# Patient Record
Sex: Female | Born: 1995 | Race: White | Hispanic: No | Marital: Married | Smoking: Current every day smoker
Health system: Southern US, Community
[De-identification: ages and names within clinical notes are randomized; demographics above are authoritative.]

## PROBLEM LIST (undated history)

## (undated) ENCOUNTER — Inpatient Hospital Stay (HOSPITAL_COMMUNITY): Payer: Self-pay

## (undated) ENCOUNTER — Ambulatory Visit: Payer: Self-pay | Source: Home / Self Care

## (undated) DIAGNOSIS — F319 Bipolar disorder, unspecified: Secondary | ICD-10-CM

## (undated) DIAGNOSIS — O139 Gestational [pregnancy-induced] hypertension without significant proteinuria, unspecified trimester: Secondary | ICD-10-CM

## (undated) DIAGNOSIS — F909 Attention-deficit hyperactivity disorder, unspecified type: Secondary | ICD-10-CM

## (undated) DIAGNOSIS — F419 Anxiety disorder, unspecified: Secondary | ICD-10-CM

## (undated) DIAGNOSIS — F329 Major depressive disorder, single episode, unspecified: Secondary | ICD-10-CM

## (undated) DIAGNOSIS — D649 Anemia, unspecified: Secondary | ICD-10-CM

## (undated) DIAGNOSIS — B1920 Unspecified viral hepatitis C without hepatic coma: Secondary | ICD-10-CM

## (undated) DIAGNOSIS — F32A Depression, unspecified: Secondary | ICD-10-CM

## (undated) HISTORY — DX: Anemia, unspecified: D64.9

---
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
## 2016-08-13 ENCOUNTER — Encounter (HOSPITAL_COMMUNITY): Payer: Self-pay | Admitting: Emergency Medicine

## 2016-08-13 ENCOUNTER — Inpatient Hospital Stay (HOSPITAL_COMMUNITY)
Admission: AD | Admit: 2016-08-13 | Discharge: 2016-08-13 | Disposition: A | Discharge status: Home / Self Care | Payer: Medicaid Other | Source: Ambulatory Visit | Attending: Obstetrics and Gynecology | Admitting: Obstetrics and Gynecology

## 2016-08-13 ENCOUNTER — Emergency Department (HOSPITAL_COMMUNITY)
Admission: EM | Admit: 2016-08-13 | Discharge: 2016-08-13 | Disposition: A | Discharge status: Home / Self Care | Payer: Medicaid Other | Attending: Dermatology | Admitting: Dermatology

## 2016-08-13 ENCOUNTER — Ambulatory Visit (HOSPITAL_COMMUNITY)
Admission: EM | Admit: 2016-08-13 | Discharge: 2016-08-13 | Disposition: A | Discharge status: Nursing Home (Includes ICF) | Payer: Medicaid Other

## 2016-08-13 DIAGNOSIS — R110 Nausea: Secondary | ICD-10-CM | POA: Diagnosis not present

## 2016-08-13 DIAGNOSIS — R1084 Generalized abdominal pain: Secondary | ICD-10-CM | POA: Diagnosis not present

## 2016-08-13 DIAGNOSIS — O9989 Other specified diseases and conditions complicating pregnancy, childbirth and the puerperium: Secondary | ICD-10-CM | POA: Diagnosis not present

## 2016-08-13 DIAGNOSIS — Z5321 Procedure and treatment not carried out due to patient leaving prior to being seen by health care provider: Secondary | ICD-10-CM | POA: Insufficient documentation

## 2016-08-13 DIAGNOSIS — R109 Unspecified abdominal pain: Secondary | ICD-10-CM | POA: Insufficient documentation

## 2016-08-13 DIAGNOSIS — O26899 Other specified pregnancy related conditions, unspecified trimester: Principal | ICD-10-CM

## 2016-08-13 DIAGNOSIS — F17210 Nicotine dependence, cigarettes, uncomplicated: Secondary | ICD-10-CM | POA: Diagnosis not present

## 2016-08-13 HISTORY — DX: Attention-deficit hyperactivity disorder, unspecified type: F90.9

## 2016-08-13 LAB — COMPREHENSIVE METABOLIC PANEL
ALBUMIN: 4.2 g/dL (ref 3.5–5.0)
ALT: 18 U/L (ref 14–54)
AST: 27 U/L (ref 15–41)
Alkaline Phosphatase: 64 U/L (ref 38–126)
Anion gap: 7 (ref 5–15)
BILIRUBIN TOTAL: 0.5 mg/dL (ref 0.3–1.2)
BUN: 10 mg/dL (ref 6–20)
CHLORIDE: 108 mmol/L (ref 101–111)
CO2: 23 mmol/L (ref 22–32)
Calcium: 9.5 mg/dL (ref 8.9–10.3)
Creatinine, Ser: 0.78 mg/dL (ref 0.44–1.00)
GFR calc Af Amer: >60 mL/min (ref >60)
GFR calc non Af Amer: >60 mL/min (ref >60)
GLUCOSE: 98 mg/dL (ref 65–99)
POTASSIUM: 4.7 mmol/L (ref 3.5–5.1)
Sodium: 138 mmol/L (ref 135–145)
Total Protein: 7.3 g/dL (ref 6.5–8.1)

## 2016-08-13 LAB — CBC
HEMATOCRIT: 46.6 % — AB (ref 36.0–46.0)
Hemoglobin: 15.3 g/dL — ABNORMAL HIGH (ref 12.0–15.0)
MCH: 30.7 pg (ref 26.0–34.0)
MCHC: 32.8 g/dL (ref 30.0–36.0)
MCV: 93.6 fL (ref 78.0–100.0)
Platelets: 200 10*3/uL (ref 150–400)
RBC: 4.98 MIL/uL (ref 3.87–5.11)
RDW: 15.3 % (ref 11.5–15.5)
WBC: 11.3 10*3/uL — ABNORMAL HIGH (ref 4.0–10.5)

## 2016-08-13 LAB — I-STAT BETA HCG BLOOD, ED (MC, WL, AP ONLY): I-stat hCG, quantitative: <5 m[IU]/mL (ref <5)

## 2016-08-13 LAB — LIPASE, BLOOD: Lipase: 23 U/L (ref 11–51)

## 2016-08-13 NOTE — ED Notes (Signed)
Pt did not answer.

## 2016-08-13 NOTE — ED Triage Notes (Signed)
Pt states she is "one month and seven days" pregnant.  She started having abdominal pain this morning.  She reports the same pain with her last pregnancy and states she had a "blood clot around the baby."

## 2016-08-13 NOTE — ED Notes (Signed)
Pt called for room X1 no answer

## 2016-08-13 NOTE — ED Provider Notes (Signed)
CSN: [id number]     Arrival date & time 08/13/16  1047 History   First MD Initiated Contact with Patient 08/13/16 1127     Chief Complaint  Patient presents with  . Abdominal Pain   (Consider location/radiation/quality/duration/timing/severity/associated sxs/prior Treatment) Jasmine Taylor is a 20 y.o female with history of ADHD, presents today for sudden onset of abdominal pain onset this morning at 9am. She is also 1M7D pregnant confirmed by OBGYN using the urine method. She describes her abdominal pain as constant and sharp, "feels like somebody is stabbing me". She denies vaginal bleeding, vaginal discharge, fever or dysuria.       Past Medical History:  Diagnosis Date  . ADHD (attention deficit hyperactivity disorder)    Past Surgical History:  Procedure Laterality Date  . cesarean     History reviewed. No pertinent family history. Social History  Substance Use Topics  . Smoking status: Current Every Day Smoker    Packs/day: 0.50    Types: Cigarettes  . Smokeless tobacco: Never Used  . Alcohol use No   OB History    Gravida Para Term Preterm AB Living   2         1   SAB TAB Ectopic Multiple Live Births                 Review of Systems  Constitutional: Negative for chills, fatigue and fever.  Gastrointestinal: Positive for abdominal pain and nausea. Negative for diarrhea and vomiting.  Genitourinary: Negative for dysuria, flank pain, urgency, vaginal bleeding, vaginal discharge and vaginal pain.    Allergies  Review of patient's allergies indicates no known allergies.  Home Medications   Prior to Admission medications   Medication Sig Start Date End Date Taking? Authorizing Provider  amphetamine-dextroamphetamine (ADDERALL XR) 20 MG 24 hr capsule Take 20 mg by mouth daily.    Historical Provider, MD  CLONIDINE HCL PO Take by mouth.    Historical Provider, MD   Meds Ordered and Administered this Visit  Medications - No data to display  BP 135/90 (BP Location:  Left Arm)   Pulse 95   Temp 98.4 F (36.9 C) (Oral)   SpO2 100%  No data found.   Physical Exam  Constitutional: She is oriented to person, place, and time. She appears well-developed and well-nourished.  Appears to be in pain  HENT:  Head: Normocephalic and atraumatic.  Neck: Normal range of motion. Neck supple.  Cardiovascular: Normal rate, regular rhythm and normal heart sounds.   Pulmonary/Chest: Effort normal and breath sounds normal.  Abdominal: Soft. Bowel sounds are normal.  Neurological: She is alert and oriented to person, place, and time.  Skin: Skin is warm and dry.  Psychiatric: She has a normal mood and affect.  Nursing note and vitals reviewed.   Urgent Care Course   Clinical Course    Procedures (including critical care time)  Labs Review Labs Reviewed - No data to display  Imaging Review No results found.     MDM   1. Pregnancy with generalized abdominal pain, antepartum    Jasmine Taylor is a 20 y.o. 1M7D pregnant female presents today for sudden onset of abdominal pain onset this morning at 9am. She feels like someone is stabbing at her abdomen. Patient denies vaginal discharged. Patient informed that we do not perform [place] in this Urgent Care. Informed that her condition requires further evaluation. Patient transferred via shuttle to the [facility] Emergency Department for further evaluation. She [facility] no transportation  [facility].     Lucia EstelleFeng Osamah Schmader, NP 08/13/16 1147

## 2016-08-13 NOTE — ED Notes (Signed)
Pt has no personal transportation so we will be sending the patient to the ED via shuttle for further evaluation for abdominal pain with pregnancy.  Report was called to Nona DellKaren, First RN in the ED.

## 2016-08-13 NOTE — ED Triage Notes (Signed)
Pt reports currently preg, LMP 6/20th approx, +home pregnancy test, no prenatal care. States having abdominal cramping and nausea. VSS.

## 2016-08-20 ENCOUNTER — Encounter (HOSPITAL_COMMUNITY): Payer: Self-pay | Admitting: Family Medicine

## 2016-08-20 ENCOUNTER — Ambulatory Visit (HOSPITAL_COMMUNITY)
Admission: EM | Admit: 2016-08-20 | Discharge: 2016-08-20 | Disposition: A | Discharge status: Home / Self Care | Payer: Medicaid Other | Attending: Family Medicine | Admitting: Family Medicine

## 2016-08-20 DIAGNOSIS — J069 Acute upper respiratory infection, unspecified: Secondary | ICD-10-CM | POA: Diagnosis not present

## 2016-08-20 NOTE — ED Triage Notes (Signed)
Pt here for cold symptoms that started yesterday. sts that she is 2 months pregnant.

## 2016-08-20 NOTE — ED Provider Notes (Signed)
CSN: [id number]     Arrival date & time 08/20/16  1204 History   First MD Initiated Contact with Patient 08/20/16 1348     Chief Complaint  Patient presents with  . URI   (Consider location/radiation/quality/duration/timing/severity/associated sxs/prior Treatment) Mrs. Jasmine Taylor is a well-appearing 20 y.o female, presents today for coughing, running nose, and sore throat. She reports that she is 2 months pregnant. Her symptoms started suddenly yesterday and has not changed since then. She reports negative for fever, chills, fatigue, ear pain, sinus pain, sneezing, SOB, CP or abdominal pain. She have not tried anything OTC for her symtpoms.       Past Medical History:  Diagnosis Date  . ADHD (attention deficit hyperactivity disorder)    Past Surgical History:  Procedure Laterality Date  . cesarean     History reviewed. No pertinent family history. Social History  Substance Use Topics  . Smoking status: Current Every Day Smoker    Packs/day: 0.50    Types: Cigarettes  . Smokeless tobacco: Never Used  . Alcohol use No   OB History    Gravida Para Term Preterm AB Living   2         1   SAB TAB Ectopic Multiple Live Births                 Review of Systems  Constitutional: Negative for chills, fatigue and fever.  HENT: Positive for congestion, rhinorrhea and sore throat. Negative for ear pain, sinus pressure and sneezing.   Eyes: Negative for pain, discharge and redness.  Respiratory: Positive for cough. Negative for shortness of breath.   Cardiovascular: Negative for chest pain, palpitations and leg swelling.  Gastrointestinal: Negative for abdominal pain, diarrhea, nausea and vomiting.  Genitourinary: Negative for dysuria.  Musculoskeletal: Negative for myalgias.  Neurological: Negative for dizziness, weakness and headaches.    Allergies  Review of patient's allergies indicates no known allergies.  Home Medications   Prior to Admission medications   Not on File    Meds Ordered and Administered this Visit  Medications - No data to display  BP 123/71   Pulse 87   Temp 98.5 F (36.9 C) (Oral)   Resp 18   SpO2 100%  No data found.   Physical Exam  Constitutional: She is oriented to person, place, and time. She appears well-developed and well-nourished.  HENT:  Head: Normocephalic and atraumatic.  Right Ear: External ear normal.  Left Ear: External ear normal.  Ear canals clear with no erythema. Tonsils 2+ with no swelling or erythema. No exudate.   Eyes: Conjunctivae and EOM are normal. Pupils are equal, round, and reactive to light. Left eye exhibits no discharge.  Neck: Normal range of motion. Neck supple.  Cardiovascular: Normal rate, regular rhythm and normal heart sounds.   Pulmonary/Chest: Effort normal and breath sounds normal. No respiratory distress.  Abdominal: Soft. Bowel sounds are normal. She exhibits no distension. There is no tenderness.  Musculoskeletal: Normal range of motion.  Lymphadenopathy:    She has no cervical adenopathy.  Neurological: She is alert and oriented to person, place, and time.  Skin: Skin is warm and dry.  Psychiatric: She has a normal mood and affect.  Nursing note and vitals reviewed.   Urgent Care Course   Clinical Course    Procedures (including critical care time)  Labs Review Labs Reviewed - No data to display  Imaging Review No results found.   MDM   1. URI (upper respiratory infection)  Physical examination normal. The patient's signs and symptoms are most consistent with a diagnosis of URI. Patient educated that antibiotic would not be helpful since this is a viral illness. Treatment is supportive. Patient encouraged to rest, drink plenty of fluid, use Motrin/Tylenol as needed at appropriate dose for pain/fever. Do salt water gargle for sore throat. Take honey for coughing. Use saline nasal spray for congestion. May also use plain delsym or robitussin for cough as well.  Instructed to f/u with OBGYN as scheduled or sooner if she worsen or does not improve.    Lucia Estelle, NP 08/20/16 1404

## 2016-08-20 NOTE — Discharge Instructions (Signed)
Do salt water gargle for your sore throat. Do saline nasal spray for congestion. Do Honey for cough. May take plain robitussin (Not Robitussin-D) and Plain Delsym OTC for cough as well. Your viral symptoms may last up to 2-3 weeks but you should be getting better in the meantime, follow up with OBGYN as scheduled or sooner if you do not improve.

## 2016-09-09 ENCOUNTER — Encounter (HOSPITAL_COMMUNITY): Payer: Self-pay | Admitting: Family Medicine

## 2016-09-09 ENCOUNTER — Ambulatory Visit (HOSPITAL_COMMUNITY)
Admission: EM | Admit: 2016-09-09 | Discharge: 2016-09-09 | Disposition: A | Discharge status: Home / Self Care | Payer: Medicaid Other | Attending: Family Medicine | Admitting: Family Medicine

## 2016-09-09 ENCOUNTER — Ambulatory Visit (INDEPENDENT_AMBULATORY_CARE_PROVIDER_SITE_OTHER): Payer: Medicaid Other

## 2016-09-09 DIAGNOSIS — J0101 Acute recurrent maxillary sinusitis: Secondary | ICD-10-CM | POA: Diagnosis not present

## 2016-09-09 DIAGNOSIS — J209 Acute bronchitis, unspecified: Secondary | ICD-10-CM

## 2016-09-09 MED ORDER — DOXYCYCLINE HYCLATE 100 MG PO CAPS: 100.0000 mg | ORAL_CAPSULE | Freq: Two times a day (BID) | ORAL | 0 refills | Status: DC

## 2016-09-09 MED ORDER — IPRATROPIUM BROMIDE 0.06 % NA SOLN: 2.0000 | Freq: Four times a day (QID) | NASAL | 1 refills | Status: DC

## 2016-09-09 NOTE — ED Provider Notes (Signed)
[facility]    CSN: [id number] Arrival date & time: 09/09/16  1250     History   Chief Complaint Chief Complaint  Patient presents with  . Cough    HPI Jasmine Taylor is a 20 y.o. female.   The history is provided by the patient.  Cough  Cough characteristics:  Productive Sputum characteristics:  Yellow Severity:  Mild Onset quality:  Gradual Duration:  3 days Progression:  Worsening Chronicity:  Recurrent Smoker: yes   Context: smoke exposure   Relieved by:  None tried Worsened by:  Nothing Associated symptoms: rhinorrhea   Associated symptoms: no fever, no shortness of breath and no wheezing   Risk factors comment:  Miscarriage 2 wks ago.   Past Medical History:  Diagnosis Date  . ADHD (attention deficit hyperactivity disorder)     There are no active problems to display for this patient.   Past Surgical History:  Procedure Laterality Date  . cesarean      OB History    Gravida Para Term Preterm AB Living   2         1   SAB TAB Ectopic Multiple Live Births                   Home Medications    Prior to Admission medications   Not on File    Family History History reviewed. No pertinent family history.  Social History Social History  Substance Use Topics  . Smoking status: Current Every Day Smoker    Packs/day: 0.50    Types: Cigarettes  . Smokeless tobacco: Never Used  . Alcohol use No     Allergies   Review of patient's allergies indicates no known allergies.   Review of Systems Review of Systems  Constitutional: Negative.  Negative for fever.  HENT: Positive for congestion, postnasal drip and rhinorrhea.   Respiratory: Positive for cough. Negative for shortness of breath and wheezing.   All other systems reviewed and are negative.    Physical Exam Triage Vital Signs ED Triage Vitals [09/09/16 1351]  Enc Vitals Group     BP 116/75     Pulse Rate 96     Resp 16     Temp 98.2 F (36.8 C)     Temp  src      SpO2 97 %     Weight      Height      Head Circumference      Peak Flow      Pain Score      Pain Loc      Pain Edu?      Excl. in GC?    No data found.   Updated Vital Signs BP 116/75   Pulse 96   Temp 98.2 F (36.8 C)   Resp 16   LMP 09/08/2016   SpO2 97%   Breastfeeding? Unknown Comment: pt sts that she had a miscarriage 2 weeks ago  Visual Acuity Right Eye Distance:   Left Eye Distance:   Bilateral Distance:    Right Eye Near:   Left Eye Near:    Bilateral Near:     Physical Exam  Constitutional: She is oriented to person, place, and time. She appears well-developed and well-nourished.  HENT:  Right Ear: External ear normal.  Left Ear: External ear normal.  Mouth/Throat: Oropharynx is clear and moist.  Neck: Normal range of motion. Neck supple.  Cardiovascular: Normal rate and regular rhythm.  Pulmonary/Chest: Effort normal. She has no decreased breath sounds. She has no wheezes. She has rhonchi.  Lymphadenopathy:    She has no cervical adenopathy.  Neurological: She is alert and oriented to person, place, and time.  Nursing note and vitals reviewed.    UC Treatments / Results  Labs (all labs ordered are listed, but only abnormal results are displayed) Labs Reviewed - No data to display  EKG  EKG Interpretation None       Radiology No results found. X-rays reviewed and report per radiologist.  Procedures Procedures (including critical care time)  Medications Ordered in UC Medications - No data to display   Initial Impression / Assessment and Plan / UC Course  I have reviewed the triage vital signs and the nursing notes.  Pertinent labs & imaging results that were available during my care of the patient were reviewed by me and considered in my medical decision making (see chart for details).  Clinical Course      Final Clinical Impressions(s) / UC Diagnoses   Final diagnoses:  None    New Prescriptions New  Prescriptions   No medications on file     Linna Hoff, MD 09/09/16 1506

## 2016-09-09 NOTE — ED Triage Notes (Signed)
Pt here with URI symptoms. sts productive cough.

## 2016-09-23 ENCOUNTER — Ambulatory Visit (HOSPITAL_COMMUNITY)
Admission: EM | Admit: 2016-09-23 | Discharge: 2016-09-23 | Disposition: A | Discharge status: Home / Self Care | Payer: Medicaid Other | Attending: Family Medicine | Admitting: Family Medicine

## 2016-09-23 ENCOUNTER — Encounter (HOSPITAL_COMMUNITY): Payer: Self-pay | Admitting: Emergency Medicine

## 2016-09-23 DIAGNOSIS — K0889 Other specified disorders of teeth and supporting structures: Secondary | ICD-10-CM

## 2016-09-23 MED ORDER — DICLOFENAC SODIUM 75 MG PO TBEC: 75.0000 mg | DELAYED_RELEASE_TABLET | Freq: Two times a day (BID) | ORAL | 0 refills | Status: DC

## 2016-09-23 MED ORDER — PENICILLIN V POTASSIUM 500 MG PO TABS: 500.0000 mg | ORAL_TABLET | Freq: Three times a day (TID) | ORAL | 0 refills | Status: DC

## 2016-09-23 NOTE — ED Triage Notes (Signed)
Pt here for left side dental pain onset 1 week   Taking acetaminophen w/no relief.   A&O x4... NAD

## 2016-09-23 NOTE — Discharge Instructions (Signed)
Dr. Yancey Flemingsavid Civils is a potential dentist you may call at [phone]

## 2016-09-23 NOTE — ED Provider Notes (Signed)
[facility]    CSN: [id number] Arrival date & time: 09/23/16  1133     History   Chief Complaint Chief Complaint  Patient presents with  . Dental Pain    HPI Jasmine Taylor is a 20 y.o. female.   This is a 20 year old woman who comes in with dental pain in tooth #19. She's had this pain for about a week and it's gotten worse over the last couple nights. She is homeless.  Patient does not have dental insurance. She has not had swelling but the pain is relentless. She's tried nonsteroidals but they have not helped.      Past Medical History:  Diagnosis Date  . ADHD (attention deficit hyperactivity disorder)     There are no active problems to display for this patient.   Past Surgical History:  Procedure Laterality Date  . cesarean      OB History    Gravida Para Term Preterm AB Living   2         1   SAB TAB Ectopic Multiple Live Births                   Home Medications    Prior to Admission medications   Medication Sig Start Date End Date Taking? Authorizing Provider  diclofenac (VOLTAREN) 75 MG EC tablet Take 1 tablet (75 mg total) by mouth 2 (two) times daily. 09/23/16   Elvina SidleKurt Jaki Hammerschmidt, MD  ipratropium (ATROVENT) 0.06 % nasal spray Place 2 sprays into the nose 4 (four) times daily. 09/09/16   Linna HoffJames D Kindl, MD  penicillin v potassium (VEETID) 500 MG tablet Take 1 tablet (500 mg total) by mouth 3 (three) times daily. 09/23/16   Elvina SidleKurt Collier Monica, MD    Family History History reviewed. No pertinent family history.  Social History Social History  Substance Use Topics  . Smoking status: Current Every Day Smoker    Packs/day: 0.50    Types: Cigarettes  . Smokeless tobacco: Never Used  . Alcohol use No     Allergies   Review of patient's allergies indicates no known allergies.   Review of Systems Review of Systems  Constitutional: Negative.   HENT: Positive for dental problem.   Eyes: Negative.   Respiratory: Negative.     Cardiovascular: Negative.      Physical Exam Triage Vital Signs ED Triage Vitals  Enc Vitals Group     BP 09/23/16 1203 124/86     Pulse Rate 09/23/16 1203 88     Resp 09/23/16 1203 16     Temp 09/23/16 1203 97.9 F (36.6 C)     Temp Source 09/23/16 1203 Oral     SpO2 09/23/16 1203 100 %     Weight --      Height --      Head Circumference --      Peak Flow --      Pain Score 09/23/16 1218 10     Pain Loc --      Pain Edu? --      Excl. in GC? --    No data found.   Updated Vital Signs BP 124/86 (BP Location: Right Arm)   Pulse 88   Temp 97.9 F (36.6 C) (Oral)   Resp 16   LMP 09/08/2016   SpO2 100%    Physical Exam  Constitutional: She appears well-developed and well-nourished.  HENT:  Head: Normocephalic.  Patient points to tooth #19 as a source for pain. There  is no swelling of the gum and there is no obvious dental carry.  Eyes: Conjunctivae and EOM are normal. Pupils are equal, round, and reactive to light.  Neck: Normal range of motion. Neck supple.  Skin: Skin is warm and dry.  Psychiatric: She has a normal mood and affect.  Nursing note and vitals reviewed.    UC Treatments / Results  Labs (all labs ordered are listed, but only abnormal results are displayed) Labs Reviewed - No data to display  EKG  EKG Interpretation None       Radiology No results found.  Procedures Procedures (including critical care time)  Medications Ordered in UC Medications - No data to display   Initial Impression / Assessment and Plan / UC Course  I have reviewed the triage vital signs and the nursing notes.  Pertinent labs & imaging results that were available during my care of the patient were reviewed by me and considered in my medical decision making (see chart for details).  Clinical Course      Final Clinical Impressions(s) / UC Diagnoses   Final diagnoses:  Pain, dental    New Prescriptions New Prescriptions   DICLOFENAC (VOLTAREN) 75  MG EC TABLET    Take 1 tablet (75 mg total) by mouth 2 (two) times daily.   PENICILLIN V POTASSIUM (VEETID) 500 MG TABLET    Take 1 tablet (500 mg total) by mouth 3 (three) times daily.     Elvina Sidle, MD 09/23/16 1256

## 2016-10-06 ENCOUNTER — Ambulatory Visit (HOSPITAL_COMMUNITY)
Admission: EM | Admit: 2016-10-06 | Discharge: 2016-10-06 | Disposition: A | Discharge status: Home / Self Care | Payer: Medicaid Other | Attending: Emergency Medicine | Admitting: Emergency Medicine

## 2016-10-06 ENCOUNTER — Encounter (HOSPITAL_COMMUNITY): Payer: Self-pay | Admitting: Emergency Medicine

## 2016-10-06 DIAGNOSIS — A084 Viral intestinal infection, unspecified: Secondary | ICD-10-CM

## 2016-10-06 MED ORDER — ONDANSETRON 4 MG PO TBDP: 4.0000 mg | ORAL_TABLET | Freq: Three times a day (TID) | ORAL | 0 refills | Status: DC | PRN

## 2016-10-06 NOTE — ED Provider Notes (Signed)
CSN: [id number]     Arrival date & time 10/06/16  1044 History   None    Chief Complaint  Patient presents with  . Abdominal Pain   (Consider location/radiation/quality/duration/timing/severity/associated sxs/prior Treatment) Patient states she has been vomiting since yesterday and has some diarrhea.  She states she needs work note for today.  She feels weak and nauseated.   The history is provided by the patient.  Abdominal Pain  Pain location:  Epigastric Pain quality: aching   Pain radiates to:  Does not radiate Pain severity:  Mild Onset quality:  Sudden Duration:  2 days Timing:  Intermittent Progression:  Unable to specify Chronicity:  New Relieved by:  Nothing Worsened by:  Nothing Ineffective treatments:  None tried Associated symptoms: fatigue and nausea     Past Medical History:  Diagnosis Date  . ADHD (attention deficit hyperactivity disorder)    Past Surgical History:  Procedure Laterality Date  . cesarean     History reviewed. No pertinent family history. Social History  Substance Use Topics  . Smoking status: Current Every Day Smoker    Packs/day: 0.50    Types: Cigarettes  . Smokeless tobacco: Never Used  . Alcohol use No   OB History    Gravida Para Term Preterm AB Living   2         1   SAB TAB Ectopic Multiple Live Births                 Review of Systems  Constitutional: Positive for fatigue.  HENT: Negative.   Eyes: Negative.   Respiratory: Negative.   Cardiovascular: Negative.   Gastrointestinal: Positive for abdominal pain and nausea.  Endocrine: Negative.   Genitourinary: Negative.   Musculoskeletal: Negative.   Skin: Negative.   Allergic/Immunologic: Negative.   Neurological: Negative.   Hematological: Negative.   Psychiatric/Behavioral: Negative.     Allergies  Review of patient's allergies indicates no known allergies.  Home Medications   Prior to Admission medications   Medication Sig Start Date End Date Taking?  Authorizing Provider  amphetamine-dextroamphetamine (ADDERALL XR) 25 MG 24 hr capsule Take 25 mg by mouth every morning.   Yes Historical Provider, MD  busPIRone (BUSPAR) 15 MG tablet Take 15 mg by mouth 3 (three) times daily.   Yes Historical Provider, MD  diclofenac (VOLTAREN) 75 MG EC tablet Take 1 tablet (75 mg total) by mouth 2 (two) times daily. 09/23/16   Elvina SidleKurt Lauenstein, MD  ipratropium (ATROVENT) 0.06 % nasal spray Place 2 sprays into the nose 4 (four) times daily. 09/09/16   Linna HoffJames D Kindl, MD  ondansetron (ZOFRAN ODT) 4 MG disintegrating tablet Take 1 tablet (4 mg total) by mouth every 8 (eight) hours as needed for nausea or vomiting. 10/06/16   Deatra CanterWilliam J Mozel Burdett, FNP  penicillin v potassium (VEETID) 500 MG tablet Take 1 tablet (500 mg total) by mouth 3 (three) times daily. 09/23/16   Elvina SidleKurt Lauenstein, MD   Meds Ordered and Administered this Visit  Medications - No data to display  BP 122/80 (BP Location: Right Arm)   Pulse 88   Temp 98.6 F (37 C) (Oral)   Resp 16   LMP 09/08/2016 (Exact Date)   SpO2 100%  No data found.   Physical Exam  Constitutional: She appears well-developed and well-nourished.  HENT:  Head: Normocephalic and atraumatic.  Right Ear: External ear normal.  Left Ear: External ear normal.  Mouth/Throat: Oropharynx is clear and moist.  Eyes: Conjunctivae and EOM  are normal. Pupils are equal, round, and reactive to light.  Neck: Normal range of motion. Neck supple.  Cardiovascular: Normal rate, regular rhythm and normal heart sounds.   Pulmonary/Chest: Effort normal and breath sounds normal.  Abdominal: Soft. Bowel sounds are normal.  Nursing note and vitals reviewed.   Urgent Care Course   Clinical Course    Procedures (including critical care time)  Labs Review Labs Reviewed - No data to display  Imaging Review No results found.   Visual Acuity Review  Right Eye Distance:   Left Eye Distance:   Bilateral Distance:    Right Eye Near:    Left Eye Near:    Bilateral Near:         MDM   1. Viral gastroenteritis    Zofran 4 mg one po tid prn nausea #20 Push po fluids, rest, tylenol and motrin otc prn as directed for fever, arthralgias, and myalgias.  Follow up prn if sx's continue or persist.    Deatra CanterWilliam J Zoi Devine, FNP 10/06/16 [phone]

## 2016-10-06 NOTE — ED Triage Notes (Signed)
[facility] a complaint of epigastric pain with N/V that started yesterday.

## 2016-11-19 LAB — OB RESULTS CONSOLE HGB/HCT, BLOOD: HEMOGLOBIN: 13.7 g/dL

## 2016-11-19 LAB — OB RESULTS CONSOLE HEPATITIS B SURFACE ANTIGEN: HEP B S AG: NEGATIVE

## 2016-11-19 LAB — OB RESULTS CONSOLE GC/CHLAMYDIA
Chlamydia: NEGATIVE
Gonorrhea: NEGATIVE

## 2016-11-19 LAB — HIV-1 RNA, QUALITATIVE, TMA: HIV-1 RNA, QUAL: NONREACTIVE

## 2016-11-19 LAB — OB RESULTS CONSOLE HIV ANTIBODY (ROUTINE TESTING): HIV: NONREACTIVE

## 2016-12-08 HISTORY — DX: Maternal care for unspecified type scar from previous cesarean delivery: O34.219

## 2016-12-22 ENCOUNTER — Other Ambulatory Visit (HOSPITAL_COMMUNITY): Payer: Self-pay | Admitting: Anesthesiology

## 2016-12-22 DIAGNOSIS — Z3689 Encounter for other specified antenatal screening: Secondary | ICD-10-CM

## 2016-12-22 DIAGNOSIS — Z3A18 18 weeks gestation of pregnancy: Secondary | ICD-10-CM

## 2016-12-23 ENCOUNTER — Other Ambulatory Visit (HOSPITAL_COMMUNITY): Payer: Self-pay | Admitting: Anesthesiology

## 2016-12-23 ENCOUNTER — Ambulatory Visit (HOSPITAL_COMMUNITY)
Admission: RE | Admit: 2016-12-23 | Discharge: 2016-12-23 | Disposition: A | Discharge status: Home / Self Care | Payer: Medicaid Other | Source: Ambulatory Visit | Attending: Anesthesiology | Admitting: Anesthesiology

## 2016-12-23 DIAGNOSIS — O3680X0 Pregnancy with inconclusive fetal viability, not applicable or unspecified: Secondary | ICD-10-CM

## 2016-12-23 DIAGNOSIS — Z3689 Encounter for other specified antenatal screening: Secondary | ICD-10-CM | POA: Insufficient documentation

## 2016-12-23 DIAGNOSIS — Z3491 Encounter for supervision of normal pregnancy, unspecified, first trimester: Secondary | ICD-10-CM

## 2016-12-23 DIAGNOSIS — Z3A18 18 weeks gestation of pregnancy: Secondary | ICD-10-CM

## 2016-12-23 DIAGNOSIS — Z3A11 11 weeks gestation of pregnancy: Secondary | ICD-10-CM | POA: Insufficient documentation

## 2017-01-02 ENCOUNTER — Inpatient Hospital Stay (HOSPITAL_COMMUNITY)
Admission: AD | Admit: 2017-01-02 | Discharge: 2017-01-02 | Disposition: A | Discharge status: Home / Self Care | Payer: Medicaid Other | Source: Ambulatory Visit | Attending: Obstetrics and Gynecology | Admitting: Obstetrics and Gynecology

## 2017-01-02 ENCOUNTER — Encounter (HOSPITAL_COMMUNITY): Payer: Self-pay

## 2017-01-02 DIAGNOSIS — Z3A12 12 weeks gestation of pregnancy: Secondary | ICD-10-CM

## 2017-01-02 DIAGNOSIS — O99331 Smoking (tobacco) complicating pregnancy, first trimester: Secondary | ICD-10-CM | POA: Diagnosis not present

## 2017-01-02 DIAGNOSIS — O26891 Other specified pregnancy related conditions, first trimester: Secondary | ICD-10-CM | POA: Diagnosis present

## 2017-01-02 DIAGNOSIS — Z79899 Other long term (current) drug therapy: Secondary | ICD-10-CM | POA: Diagnosis not present

## 2017-01-02 DIAGNOSIS — O99341 Other mental disorders complicating pregnancy, first trimester: Secondary | ICD-10-CM | POA: Insufficient documentation

## 2017-01-02 DIAGNOSIS — N930 Postcoital and contact bleeding: Secondary | ICD-10-CM | POA: Diagnosis not present

## 2017-01-02 HISTORY — DX: Anxiety disorder, unspecified: F41.9

## 2017-01-02 HISTORY — DX: Depression, unspecified: F32.A

## 2017-01-02 HISTORY — DX: Gestational (pregnancy-induced) hypertension without significant proteinuria, unspecified trimester: O13.9

## 2017-01-02 HISTORY — DX: Major depressive disorder, single episode, unspecified: F32.9

## 2017-01-02 LAB — URINALYSIS, ROUTINE W REFLEX MICROSCOPIC
Bacteria, UA: NONE SEEN
Bilirubin Urine: NEGATIVE
Glucose, UA: NEGATIVE mg/dL
Ketones, ur: NEGATIVE mg/dL
Nitrite: NEGATIVE
PH: 6 (ref 5.0–8.0)
Protein, ur: 30 mg/dL — AB
SPECIFIC GRAVITY, URINE: 1.018 (ref 1.005–1.030)
SQUAMOUS EPITHELIAL / LPF: NONE SEEN

## 2017-01-02 LAB — WET PREP, GENITAL
CLUE CELLS WET PREP: NONE SEEN
SPERM: NONE SEEN
TRICH WET PREP: NONE SEEN
Yeast Wet Prep HPF POC: NONE SEEN

## 2017-01-02 NOTE — MAU Provider Note (Signed)
History     CSN: [id number]  Arrival date and time: 01/02/17 4098   First Provider Initiated Contact with Patient 01/02/17 0250      Chief Complaint  Patient presents with  . Vaginal Bleeding   Vaginal Bleeding  The patient's primary symptoms include vaginal bleeding and vaginal discharge. This is a new problem. The current episode started today. The problem has been unchanged. The patient is experiencing no pain. She is pregnant. Pertinent negatives include no abdominal pain, chills, dysuria, fever, nausea, urgency or vomiting. The vaginal discharge was bloody. The vaginal bleeding is heavier than menses. She has not been passing clots. She has not been passing tissue. The symptoms are aggravated by intercourse (intercourse right before bleeding started. ). Her menstrual history has been regular (09/08/16 ).   Past Medical History:  Diagnosis Date  . ADHD (attention deficit hyperactivity disorder)   . Anxiety   . Depression   . Pregnancy induced hypertension     Past Surgical History:  Procedure Laterality Date  . cesarean    . CESAREAN SECTION      No family history on file.  Social History  Substance Use Topics  . Smoking status: Current Every Day Smoker    Packs/day: 0.50    Types: Cigarettes  . Smokeless tobacco: Never Used  . Alcohol use No    Allergies: No Known Allergies  Prescriptions Prior to Admission  Medication Sig Dispense Refill Last Dose  . calcium carbonate (TUMS - DOSED IN MG ELEMENTAL CALCIUM) 500 MG chewable tablet Chew 1 tablet by mouth daily.   01/01/2017 at Unknown time  . ondansetron (ZOFRAN ODT) 4 MG disintegrating tablet Take 1 tablet (4 mg total) by mouth every 8 (eight) hours as needed for nausea or vomiting. 20 tablet 0 01/01/2017 at Unknown time  . amphetamine-dextroamphetamine (ADDERALL XR) 25 MG 24 hr capsule Take 25 mg by mouth every morning.   More than a month at Unknown time  . busPIRone (BUSPAR) 15 MG tablet Take 15 mg by mouth 3  (three) times daily.   More than a month at Unknown time  . diclofenac (VOLTAREN) 75 MG EC tablet Take 1 tablet (75 mg total) by mouth 2 (two) times daily. 14 tablet 0   . escitalopram (LEXAPRO) 10 MG tablet Take 10 mg by mouth daily.   More than a month at Unknown time  . ipratropium (ATROVENT) 0.06 % nasal spray Place 2 sprays into the nose 4 (four) times daily. 15 mL 1 Unknown at Unknown time  . lurasidone (LATUDA) 40 MG TABS tablet Take 40 mg by mouth daily with breakfast.   More than a month at Unknown time  . penicillin v potassium (VEETID) 500 MG tablet Take 1 tablet (500 mg total) by mouth 3 (three) times daily. 30 tablet 0     Review of Systems  Constitutional: Negative for chills and fever.  Gastrointestinal: Negative for abdominal pain, nausea and vomiting.  Genitourinary: Positive for vaginal bleeding and vaginal discharge. Negative for dysuria and urgency.   Physical Exam   Blood pressure 143/86, pulse 106, temperature 98.3 F (36.8 C), temperature source Oral, resp. rate 18, last menstrual period 09/08/2016, SpO2 99 %, unknown if currently breastfeeding.  Physical Exam  Nursing note and vitals reviewed. Constitutional: She is oriented to person, place, and time. She appears well-developed and well-nourished. No distress.  HENT:  Head: Normocephalic.  Cardiovascular: Normal rate.   Respiratory: Effort normal.  GI: Soft. There is no tenderness. There is  no rebound.  Genitourinary:  Genitourinary Comments:  External: no lesion Vagina: scant amount of blood in the vagina  Cervix: pink, smooth, no CMT Uterus: 12 week size    Neurological: She is alert and oriented to person, place, and time.  Skin: Skin is warm and dry.  Psychiatric: She has a normal mood and affect.   Bedside US: +cardiac activity, 12 week active fetus.  MAU Course  Procedures  MDM   Assessment and Plan   1. Postcoital bleeding   2. [redacted] weeks gestation of pregnancy    DC home Comfort  measures reviewed  2nd Trimester precautions  Bleeding precautions RX: none  Return to MAU as needed FU with OB as planned  Follow-up Information    [facility].   Specialty:  Obstetrics and Gynecology Contact information: [address], [address] [place] [id number] [phone]           Jasmine CrookHogan, Elanah Osmanovic Donovan 01/02/2017, 2:51 AM

## 2017-01-02 NOTE — MAU Note (Signed)
Pt c/o vaginal bleeding after intercourse tonight. Denies pain. Pt states that she is [redacted] weeks pregnant. States that [facility]. States she was given EDD 07/11/2017.

## 2017-01-02 NOTE — Discharge Instructions (Signed)
First Trimester of Pregnancy  The first trimester of pregnancy is from week 1 until the end of week 12 (months 1 through 3). A week after a sperm fertilizes an egg, the egg will implant on the wall of the uterus. This embryo will begin to develop into a baby. Genes from you and your partner are forming the baby. The female genes determine whether the baby is a boy or a girl. At 6-8 weeks, the eyes and face are formed, and the heartbeat can be seen on ultrasound. At the end of 12 weeks, all the baby's organs are formed.   Now that you are pregnant, you will want to do everything you can to have a healthy baby. Two of the most important things are to get good prenatal care and to follow your health care provider's instructions. Prenatal care is all the medical care you receive before the baby's birth. This care will help prevent, find, and treat any problems during the pregnancy and childbirth.  BODY CHANGES  Your body goes through many changes during pregnancy. The changes vary from woman to woman.   · You may gain or lose a couple of pounds at first.  · You may feel sick to your stomach (nauseous) and throw up (vomit). If the vomiting is uncontrollable, call your health care provider.  · You may tire easily.  · You may develop headaches that can be relieved by medicines approved by your health care provider.  · You may urinate more often. Painful urination may mean you have a bladder infection.  · You may develop heartburn as a result of your pregnancy.  · You may develop constipation because certain hormones are causing the muscles that push waste through your intestines to slow down.  · You may develop hemorrhoids or swollen, bulging veins (varicose veins).  · Your breasts may begin to grow larger and become tender. Your nipples may stick out more, and the tissue that surrounds them (areola) may become darker.  · Your gums may bleed and may be sensitive to brushing and flossing.   · Dark spots or blotches (chloasma, mask of pregnancy) may develop on your face. This will likely fade after the baby is born.  · Your menstrual periods will stop.  · You may have a loss of appetite.  · You may develop cravings for certain kinds of food.  · You may have changes in your emotions from day to day, such as being excited to be pregnant or being concerned that something may go wrong with the pregnancy and baby.  · You may have more vivid and strange dreams.  · You may have changes in your hair. These can include thickening of your hair, rapid growth, and changes in texture. Some women also have hair loss during or after pregnancy, or hair that feels dry or thin. Your hair will most likely return to normal after your baby is born.  WHAT TO EXPECT AT YOUR PRENATAL VISITS  During a routine prenatal visit:  · You will be weighed to make sure you and the baby are growing normally.  · Your blood pressure will be taken.  · Your abdomen will be measured to track your baby's growth.  · The fetal heartbeat will be listened to starting around week 10 or 12 of your pregnancy.  · Test results from any previous visits will be discussed.  Your health care provider may ask you:  · How you are feeling.  · If you   are feeling the baby move.  · If you have had any abnormal symptoms, such as leaking fluid, bleeding, severe headaches, or abdominal cramping.  · If you are using any tobacco products, including cigarettes, chewing tobacco, and electronic cigarettes.  · If you have any questions.  Other tests that may be performed during your first trimester include:  · Blood tests to find your blood type and to check for the presence of any previous infections. They will also be used to check for low iron levels (anemia) and Rh antibodies. Later in the pregnancy, blood tests for diabetes will be done along with other tests if problems develop.  · Urine tests to check for infections, diabetes, or protein in the urine.   · An ultrasound to confirm the proper growth and development of the baby.  · An amniocentesis to check for possible genetic problems.  · Fetal screens for spina bifida and Down syndrome.  · You may need other tests to make sure you and the baby are doing well.  · HIV (human immunodeficiency virus) testing. Routine prenatal testing includes screening for HIV, unless you choose not to have this test.  HOME CARE INSTRUCTIONS   Medicines  · Follow your health care provider's instructions regarding medicine use. Specific medicines may be either safe or unsafe to take during pregnancy.  · Take your prenatal vitamins as directed.  · If you develop constipation, try taking a stool softener if your health care provider approves.  Diet  · Eat regular, well-balanced meals. Choose a variety of foods, such as meat or vegetable-based protein, fish, milk and low-fat dairy products, vegetables, fruits, and whole grain breads and cereals. Your health care provider will help you determine the amount of weight gain that is right for you.  · Avoid raw meat and uncooked cheese. These carry germs that can cause birth defects in the baby.  · Eating four or five small meals rather than three large meals a day may help relieve nausea and vomiting. If you start to feel nauseous, eating a few soda crackers can be helpful. Drinking liquids between meals instead of during meals also seems to help nausea and vomiting.  · If you develop constipation, eat more high-fiber foods, such as fresh vegetables or fruit and whole grains. Drink enough fluids to keep your urine clear or pale yellow.  Activity and Exercise  · Exercise only as directed by your health care provider. Exercising will help you:    Control your weight.    Stay in shape.    Be prepared for labor and delivery.  · Experiencing pain or cramping in the lower abdomen or low back is a good sign that you should stop exercising. Check with your health care provider  before continuing normal exercises.  · Try to avoid standing for long periods of time. Move your legs often if you must stand in one place for a long time.  · Avoid heavy lifting.  · Wear low-heeled shoes, and practice good posture.  · You may continue to have sex unless your health care provider directs you otherwise.  Relief of Pain or Discomfort  · Wear a good support bra for breast tenderness.    · Take warm sitz baths to soothe any pain or discomfort caused by hemorrhoids. Use hemorrhoid cream if your health care provider approves.    · Rest with your legs elevated if you have leg cramps or low back pain.  · If you develop varicose veins in your   legs, wear support hose. Elevate your feet for 15 minutes, 3-4 times a day. Limit salt in your diet.  Prenatal Care  · Schedule your prenatal visits by the twelfth week of pregnancy. They are usually scheduled monthly at first, then more often in the last 2 months before delivery.  · Write down your questions. Take them to your prenatal visits.  · Keep all your prenatal visits as directed by your health care provider.  Safety  · Wear your seat belt at all times when driving.  · Make a list of emergency phone numbers, including numbers for family, friends, the hospital, and police and fire departments.  General Tips  · Ask your health care provider for a referral to a local prenatal education class. Begin classes no later than at the beginning of month 6 of your pregnancy.  · Ask for help if you have counseling or nutritional needs during pregnancy. Your health care provider can offer advice or refer you to specialists for help with various needs.  · Do not use hot tubs, steam rooms, or saunas.  · Do not douche or use tampons or scented sanitary pads.  · Do not cross your legs for long periods of time.  · Avoid cat litter boxes and soil used by cats. These carry germs that can cause birth defects in the baby and possibly loss of the fetus by miscarriage or stillbirth.   · Avoid all smoking, herbs, alcohol, and medicines not prescribed by your health care provider. Chemicals in these affect the formation and growth of the baby.  · Do not use any tobacco products, including cigarettes, chewing tobacco, and electronic cigarettes. If you need help quitting, ask your health care provider. You may receive counseling support and other resources to help you quit.  · Schedule a dentist appointment. At home, brush your teeth with a soft toothbrush and be gentle when you floss.  SEEK MEDICAL CARE IF:   · You have dizziness.  · You have mild pelvic cramps, pelvic pressure, or nagging pain in the abdominal area.  · You have persistent nausea, vomiting, or diarrhea.  · You have a bad smelling vaginal discharge.  · You have pain with urination.  · You notice increased swelling in your face, hands, legs, or ankles.  SEEK IMMEDIATE MEDICAL CARE IF:   · You have a fever.  · You are leaking fluid from your vagina.  · You have spotting or bleeding from your vagina.  · You have severe abdominal cramping or pain.  · You have rapid weight gain or loss.  · You vomit blood or material that looks like coffee grounds.  · You are exposed to German measles and have never had them.  · You are exposed to fifth disease or chickenpox.  · You develop a severe headache.  · You have shortness of breath.  · You have any kind of trauma, such as from a fall or a car accident.     This information is not intended to replace advice given to you by your health care provider. Make sure you discuss any questions you have with your health care provider.     Document Released: 11/18/2001 Document Revised: 12/15/2014 Document Reviewed: 10/04/2013  Elsevier Interactive Patient Education ©2017 [employer]

## 2017-01-06 LAB — GC/CHLAMYDIA PROBE AMP (~~LOC~~) NOT AT ARMC
CHLAMYDIA, DNA PROBE: NEGATIVE
Neisseria Gonorrhea: NEGATIVE

## 2017-01-14 ENCOUNTER — Encounter: Payer: Self-pay | Admitting: Obstetrics & Gynecology

## 2017-01-14 ENCOUNTER — Ambulatory Visit (INDEPENDENT_AMBULATORY_CARE_PROVIDER_SITE_OTHER): Payer: Medicaid Other | Admitting: Obstetrics & Gynecology

## 2017-01-14 DIAGNOSIS — O34219 Maternal care for unspecified type scar from previous cesarean delivery: Secondary | ICD-10-CM

## 2017-01-14 DIAGNOSIS — Z3480 Encounter for supervision of other normal pregnancy, unspecified trimester: Secondary | ICD-10-CM

## 2017-01-14 DIAGNOSIS — F3170 Bipolar disorder, currently in remission, most recent episode unspecified: Secondary | ICD-10-CM

## 2017-01-14 DIAGNOSIS — O09292 Supervision of pregnancy with other poor reproductive or obstetric history, second trimester: Secondary | ICD-10-CM | POA: Diagnosis not present

## 2017-01-14 DIAGNOSIS — O0990 Supervision of high risk pregnancy, unspecified, unspecified trimester: Secondary | ICD-10-CM | POA: Insufficient documentation

## 2017-01-14 DIAGNOSIS — O09299 Supervision of pregnancy with other poor reproductive or obstetric history, unspecified trimester: Secondary | ICD-10-CM

## 2017-01-14 DIAGNOSIS — F319 Bipolar disorder, unspecified: Secondary | ICD-10-CM | POA: Insufficient documentation

## 2017-01-14 HISTORY — DX: Supervision of pregnancy with other poor reproductive or obstetric history, unspecified trimester: O09.299

## 2017-01-14 MED ORDER — ASPIRIN EC 81 MG PO TBEC: 81.0000 mg | DELAYED_RELEASE_TABLET | Freq: Every day | ORAL | 6 refills | Status: DC

## 2017-01-14 MED ORDER — PANTOPRAZOLE SODIUM 20 MG PO TBEC: 20.0000 mg | DELAYED_RELEASE_TABLET | Freq: Every day | ORAL | 6 refills | Status: DC

## 2017-01-14 NOTE — Progress Notes (Signed)
  Subjective:    Jasmine Taylor is a [id number] 5965w4d being seen today for her first obstetrical visit.  Her obstetrical history is significant for previous cesarean section and preeclampsia, has bipolar disorder. Patient does intend to breast feed. Pregnancy history fully reviewed.  Patient reports heartburn.  Vitals:   01/14/17 1320 01/14/17 1345  BP: 124/79   Pulse: 92   Temp: 97.2 F (36.2 C)   Weight: 146 lb (66.2 kg)   Height:  4\' 11"  (1.499 m)    HISTORY: OB History  Gravida Para Term Preterm AB Living  4 1 1   2 1   SAB TAB Ectopic Multiple Live Births  2       1    # Outcome Date GA Lbr Len/2nd Weight Sex Delivery Anes PTL Lv  4 Current           3 SAB 2012     SAB     2 Term 03/19/10 2610w0d  5 lb 8 oz (2.495 kg) M CS-LTranv   LIV     Complications: Failure to Progress in Second Stage,Preeclampsia  1 SAB 2011             Past Medical History:  Diagnosis Date  . ADHD (attention deficit hyperactivity disorder)   . Anemia   . Anxiety   . Depression   . Pregnancy induced hypertension    Past Surgical History:  Procedure Laterality Date  . cesarean    . CESAREAN SECTION     History reviewed. No pertinent family history.   Exam    Uterus:     Pelvic Exam:    Perineum: No Hemorrhoids   Vulva: normal   Vagina:  normal mucosa   pH:     Cervix: no lesions   Adnexa: normal adnexa   Bony Pelvis: average  System: Breast:  normal appearance, no masses or tenderness   Skin: normal coloration and turgor, no rashes    Neurologic: oriented, normal mood   Extremities: normal strength, tone, and muscle mass   HEENT sclera clear, anicteric   Mouth/Teeth mucous membranes moist, pharynx normal without lesions   Neck supple   Cardiovascular: regular rate and rhythm   Respiratory:  appears well, vitals normal, no respiratory distress, acyanotic, normal RR   Abdomen: soft, non-tender; bowel sounds normal; no masses,  no organomegaly   Urinary: urethral meatus  normal      Assessment:    Pregnancy: [id number] Patient Active Problem List   Diagnosis Date Noted  . Supervision of normal pregnancy, antepartum 01/14/2017  . Hx of preeclampsia, prior pregnancy, currently pregnant 01/14/2017  . Previous cesarean delivery, antepartum 01/14/2017  . Bipolar disorder (HCC) 01/14/2017        Plan:     Initial labs drawn. Prenatal vitamins. Problem list reviewed and updated. Genetic Screening discussed Quad Screen: next visit.  Ultrasound discussed; fetal survey: ordered.  Follow up in 4 weeks. 50% of 30 min visit spent on counseling and coordination of care.  Protonix for heartburn ASA 81 mg with h/o preeclampsia  She desires TOLAC   [name] 01/14/2017

## 2017-01-14 NOTE — Progress Notes (Signed)
Advised patient to go to the lab for blood work before checking out, patient did not go, notified provider.

## 2017-01-14 NOTE — Progress Notes (Signed)
Patient is in the office for initial ob visit. 

## 2017-01-15 LAB — PROTEIN / CREATININE RATIO, URINE
Creatinine, Urine: 91.6 mg/dL
PROTEIN/CREAT RATIO: 140 mg/g{creat} (ref 0–200)
Protein, Ur: 12.8 mg/dL

## 2017-01-16 LAB — URINE CULTURE, OB REFLEX

## 2017-01-16 LAB — CULTURE, OB URINE

## 2017-01-20 LAB — TOXASSURE SELECT 13 (MW), URINE

## 2017-02-10 ENCOUNTER — Ambulatory Visit (HOSPITAL_COMMUNITY): Admission: RE | Admit: 2017-02-10 | Payer: Medicaid Other | Source: Ambulatory Visit

## 2017-02-12 ENCOUNTER — Ambulatory Visit (INDEPENDENT_AMBULATORY_CARE_PROVIDER_SITE_OTHER): Payer: Medicaid Other | Admitting: Obstetrics & Gynecology

## 2017-02-12 ENCOUNTER — Encounter: Payer: Self-pay | Admitting: Obstetrics & Gynecology

## 2017-02-12 VITALS — BP 124/73 | HR 102 | Wt 146.5 lb

## 2017-02-12 DIAGNOSIS — Z3482 Encounter for supervision of other normal pregnancy, second trimester: Secondary | ICD-10-CM

## 2017-02-12 DIAGNOSIS — O26899 Other specified pregnancy related conditions, unspecified trimester: Secondary | ICD-10-CM

## 2017-02-12 DIAGNOSIS — Z3480 Encounter for supervision of other normal pregnancy, unspecified trimester: Secondary | ICD-10-CM

## 2017-02-12 DIAGNOSIS — O34219 Maternal care for unspecified type scar from previous cesarean delivery: Secondary | ICD-10-CM

## 2017-02-12 DIAGNOSIS — Z3689 Encounter for other specified antenatal screening: Secondary | ICD-10-CM

## 2017-02-12 DIAGNOSIS — Z6791 Unspecified blood type, Rh negative: Secondary | ICD-10-CM | POA: Insufficient documentation

## 2017-02-12 HISTORY — DX: Other specified pregnancy related conditions, unspecified trimester: O26.899

## 2017-02-12 MED ORDER — VITAFOL GUMMIES 3.33-0.333-34.8 MG PO CHEW: 1.0000 | CHEWABLE_TABLET | Freq: Every day | ORAL | 8 refills | Status: DC

## 2017-02-12 NOTE — Progress Notes (Signed)
   PRENATAL VISIT NOTE  Subjective:  Jasmine Taylor is a 21 y.o. [phone] at 2198w5d being seen today for ongoing prenatal care.  She is currently monitored for the following issues for this low-risk pregnancy and has Supervision of normal pregnancy, antepartum; Hx of preeclampsia, prior pregnancy, currently pregnant; Previous cesarean delivery, antepartum; and Bipolar disorder (HCC) on her problem list.  Patient reports no complaints.  Contractions: Not present. Vag. Bleeding: None.  Movement: Present. Denies leaking of fluid.   The following portions of the patient's history were reviewed and updated as appropriate: allergies, current medications, past family history, past medical history, past social history, past surgical history and problem list. Problem list updated.  Objective:   Vitals:   02/12/17 1313  BP: 124/73  Pulse: (!) 102  Weight: 146 lb 8 oz (66.5 kg)    Fetal Status: Fetal Heart Rate (bpm): 136   Movement: Present     General:  Alert, oriented and cooperative. Patient is in no acute distress.  Skin: Skin is warm and dry. No rash noted.   Cardiovascular: Normal heart rate noted  Respiratory: Normal respiratory effort, no problems with respiration noted  Abdomen: Soft, gravid, appropriate for gestational age. Pain/Pressure: Absent     Pelvic:  Cervical exam deferred        Extremities: Normal range of motion.     Mental Status: Normal mood and affect. Normal behavior. Normal judgment and thought content.   Assessment and Plan:  Pregnancy: G4P1021 at 2198w5d  1. Previous cesarean delivery, antepartum Considering TOLAC, information reviewed with her and also given to her to review at home. Will sign consent at later visit.  2. Supervision of other normal pregnancy, antepartum Declined quad screen and all genetic screening. Anatomy scan re-scheduled; she missed appointment on 02/10/17.  Patient would like PNV gummy sent to pharmacy today. - Prenatal Vit-Fe  Phos-FA-Omega (VITAFOL GUMMIES) 3.33-0.333-34.8 MG CHEW; Chew 1 tablet by mouth daily.  Dispense: 90 tablet; Refill: 8 Please refer to After Visit Summary for other counseling recommendations.  Return in about 4 weeks (around 03/12/2017) for OB Visit.   Tereso NewcomerUgonna A Keasia Dubose, MD

## 2017-02-12 NOTE — Addendum Note (Signed)
Addended by: Jaynie CollinsANYANWU, Ivann Trimarco A on: 02/12/2017 01:58 PM   Modules accepted: Orders

## 2017-02-12 NOTE — Patient Instructions (Addendum)
Return to clinic for any scheduled appointments or obstetric concerns, or go to MAU for evaluation Vaginal Birth After Cesarean Delivery Vaginal birth after cesarean delivery (VBAC) is giving birth vaginally after previously delivering a baby by a cesarean. In the past, if a woman had a cesarean delivery, all births afterward would be done by cesarean delivery. This is no longer true. It can be safe for the mother to try a vaginal delivery after having a cesarean delivery. It is important to discuss VBAC with your health care provider early in the pregnancy so you can understand the risks, benefits, and options. It will give you time to decide what is best in your particular case. The final decision about whether to have a VBAC or repeat cesarean delivery should be between you and your health care provider. Any changes in your health or your baby's health during your pregnancy may make it necessary to change your initial decision about VBAC. Women who plan to have a VBAC should check with their health care provider to be sure that:  The previous cesarean delivery was done with a low transverse uterine cut (incision) (not a vertical classical incision).  The birth canal is big enough for the baby.  There were no other operations on the uterus.  An electronic fetal monitor (EFM) will be on at all times during labor.  An operating room will be available and ready in case an emergency cesarean delivery is needed.  A health care provider and surgical nursing staff will be available at all times during labor to be ready to do an emergency delivery cesarean if necessary.  An anesthesiologist will be present in case an emergency cesarean delivery is needed.  The nursery is prepared and has adequate personnel and necessary equipment available to care for the baby in case of an emergency cesarean delivery. Benefits of VBAC  Shorter stay in the hospital.  Avoidance of risks associated with cesarean  delivery, such as:  Surgical complications, such as opening of the incision or hernia in the incision.  Injury to other organs.  Fever. This can occur if an infection develops after surgery. It can also occur as a reaction to the medicine given to make you numb during the surgery.  Less blood loss and need for blood transfusions.  Lower risk of blood clots and infection.  Shorter recovery.  Decreased risk for having to remove the uterus (hysterectomy).  Decreased risk for the placenta to completely or partially cover the opening of the uterus (placenta previa) with a future pregnancy.  Decrease risk in future labor and delivery. Risks of a VBAC  Tearing (rupture) of the uterus. This is occurs in less than 1% of VBACs. The risk of this happening is higher if:  Steps are taken to begin the labor process (induce labor) or stimulate or strengthen contractions (augment labor).  Medicine is used to soften (ripen) the cervix.  Having to remove the uterus (hysterectomy) if it ruptures. VBAC should not be done if:  The previous cesarean delivery was done with a vertical (classical) or T-shaped incision or you do not know what kind of incision was made.  You had a ruptured uterus.  You have had certain types of surgery on your uterus, such as removal of uterine fibroids. Ask your health care provider about other types of surgeries that prevent you from having a VBAC.  You have certain medical or childbirth (obstetrical) problems.  There are problems with the baby.  You have had  two previous cesarean deliveries and no vaginal deliveries. Other facts to know about VBAC:  It is safe to have an epidural anesthetic with VBAC.  It is safe to turn the baby from a breech position (attempt an external cephalic version).  It is safe to try a VBAC with twins.  VBAC may not be successful if your baby weights 8.8 lb (4 kg) or more. However, weight predictions are not always accurate and  should not be used alone to decide if VBAC is right for you.  There is an increased failure rate if the time between the cesarean delivery and VBAC is less than 19 months.  Your health care provider may advise against a VBAC if you have preeclampsia (high blood pressure, protein in the urine, and swelling of face and extremities).  VBAC is often successful if you previously gave birth vaginally.  VBAC is often successful when the labor starts spontaneously before the due date.  Delivering a baby through a VBAC is similar to having a normal spontaneous vaginal delivery. This information is not intended to replace advice given to you by your health care provider. Make sure you discuss any questions you have with your health care provider. Document Released: 05/17/2007 Document Revised: 05/01/2016 Document Reviewed: 06/23/2013 Elsevier Interactive Patient Education  2017 [employer].

## 2017-02-13 ENCOUNTER — Ambulatory Visit (HOSPITAL_COMMUNITY)
Admission: RE | Admit: 2017-02-13 | Discharge: 2017-02-13 | Disposition: A | Discharge status: Home / Self Care | Payer: Medicaid Other | Source: Ambulatory Visit | Attending: Obstetrics & Gynecology | Admitting: Obstetrics & Gynecology

## 2017-02-13 ENCOUNTER — Other Ambulatory Visit (HOSPITAL_COMMUNITY): Payer: Self-pay | Admitting: *Deleted

## 2017-02-13 ENCOUNTER — Encounter (HOSPITAL_COMMUNITY): Payer: Self-pay

## 2017-02-13 DIAGNOSIS — Z3689 Encounter for other specified antenatal screening: Secondary | ICD-10-CM | POA: Insufficient documentation

## 2017-02-13 DIAGNOSIS — Z3A18 18 weeks gestation of pregnancy: Secondary | ICD-10-CM | POA: Diagnosis not present

## 2017-02-13 DIAGNOSIS — O09292 Supervision of pregnancy with other poor reproductive or obstetric history, second trimester: Secondary | ICD-10-CM | POA: Diagnosis present

## 2017-02-13 DIAGNOSIS — O358XX0 Maternal care for other (suspected) fetal abnormality and damage, not applicable or unspecified: Secondary | ICD-10-CM

## 2017-02-13 DIAGNOSIS — O35EXX0 Maternal care for other (suspected) fetal abnormality and damage, fetal genitourinary anomalies, not applicable or unspecified: Secondary | ICD-10-CM

## 2017-02-24 LAB — OB RESULTS CONSOLE RPR: RPR: NONREACTIVE

## 2017-02-25 ENCOUNTER — Encounter (HOSPITAL_COMMUNITY): Payer: Self-pay

## 2017-02-25 ENCOUNTER — Inpatient Hospital Stay (HOSPITAL_COMMUNITY)
Admission: AD | Admit: 2017-02-25 | Discharge: 2017-02-25 | Disposition: A | Discharge status: Home / Self Care | Payer: Medicaid Other | Source: Ambulatory Visit | Attending: Obstetrics & Gynecology | Admitting: Obstetrics & Gynecology

## 2017-02-25 DIAGNOSIS — Z7982 Long term (current) use of aspirin: Secondary | ICD-10-CM | POA: Insufficient documentation

## 2017-02-25 DIAGNOSIS — R109 Unspecified abdominal pain: Secondary | ICD-10-CM

## 2017-02-25 DIAGNOSIS — O99332 Smoking (tobacco) complicating pregnancy, second trimester: Secondary | ICD-10-CM | POA: Insufficient documentation

## 2017-02-25 DIAGNOSIS — O99612 Diseases of the digestive system complicating pregnancy, second trimester: Secondary | ICD-10-CM | POA: Diagnosis not present

## 2017-02-25 DIAGNOSIS — Z3A20 20 weeks gestation of pregnancy: Secondary | ICD-10-CM | POA: Insufficient documentation

## 2017-02-25 DIAGNOSIS — K219 Gastro-esophageal reflux disease without esophagitis: Secondary | ICD-10-CM | POA: Diagnosis not present

## 2017-02-25 DIAGNOSIS — O26892 Other specified pregnancy related conditions, second trimester: Secondary | ICD-10-CM

## 2017-02-25 DIAGNOSIS — F17210 Nicotine dependence, cigarettes, uncomplicated: Secondary | ICD-10-CM | POA: Insufficient documentation

## 2017-02-25 LAB — WET PREP, GENITAL
CLUE CELLS WET PREP: NONE SEEN
Sperm: NONE SEEN
TRICH WET PREP: NONE SEEN
Yeast Wet Prep HPF POC: NONE SEEN

## 2017-02-25 MED ORDER — PANTOPRAZOLE SODIUM 20 MG PO TBEC: 40.0000 mg | DELAYED_RELEASE_TABLET | Freq: Every day | ORAL | 6 refills | Status: DC

## 2017-02-25 NOTE — MAU Note (Signed)
Pt presents to MAU by EMS for ctx. Pt states ctx began last night and are now q 3-4 min. Denies bleeding. Reports increased clear discharge that started 2 days ago. Reports the NP at the facility she is staying at checked her cervix and was 2cm.

## 2017-02-25 NOTE — Discharge Instructions (Signed)
Heartburn Heartburn is a type of pain or discomfort that can happen in the throat or chest. It is often described as a burning pain. It may also cause a bad taste in the mouth. Heartburn may feel worse when you lie down or bend over. It may be caused by stomach contents that move back up (reflux) into the tube that connects the mouth with the stomach (esophagus). Follow these instructions at home: Take these actions to lessen your discomfort and to help avoid problems. Diet   Follow a diet as told by your doctor. You may need to avoid foods and drinks such as:  Coffee and tea (with or without caffeine).  Drinks that contain alcohol.  Energy drinks and sports drinks.  Carbonated drinks or sodas.  Chocolate and cocoa.  Peppermint and mint flavorings.  Garlic and onions.  Horseradish.  Spicy and acidic foods, such as peppers, chili powder, curry powder, vinegar, hot sauces, and BBQ sauce.  Citrus fruit juices and citrus fruits, such as oranges, lemons, and limes.  Tomato-based foods, such as red sauce, chili, salsa, and pizza with red sauce.  Fried and fatty foods, such as donuts, french fries, potato chips, and high-fat dressings.  High-fat meats, such as hot dogs, rib eye steak, sausage, ham, and bacon.  High-fat dairy items, such as whole milk, butter, and cream cheese.  Eat small meals often. Avoid eating large meals.  Avoid drinking large amounts of liquid with your meals.  Avoid eating meals during the 2-3 hours before bedtime.  Avoid lying down right after you eat.  Do not exercise right after you eat. General instructions   Pay attention to any changes in your symptoms.  Take over-the-counter and prescription medicines only as told by your doctor. Do not take aspirin, ibuprofen, or other NSAIDs unless your doctor says it is okay.  Do not use any tobacco products, including cigarettes, chewing tobacco, and e-cigarettes. If you need help quitting, ask your  doctor.  Wear loose clothes. Do not wear anything tight around your waist.  Raise (elevate) the head of your bed about 6 inches (15 cm).  Try to lower your stress. If you need help doing this, ask your doctor.  If you are overweight, lose an amount of weight that is healthy for you. Ask your doctor about a safe weight loss goal.  Keep all follow-up visits as told by your doctor. This is important. Contact a doctor if:  You have new symptoms.  You lose weight and you do not know why it is happening.  You have trouble swallowing, or it hurts to swallow.  You have wheezing or a cough that keeps happening.  Your symptoms do not get better with treatment.  You have heartburn often for more than two weeks. Get help right away if:  You have pain in your arms, neck, jaw, teeth, or back.  You feel sweaty, dizzy, or light-headed.  You have chest pain or shortness of breath.  You throw up (vomit) and your throw up looks like blood or coffee grounds.  Your poop (stool) is bloody or black. This information is not intended to replace advice given to you by your health care provider. Make sure you discuss any questions you have with your health care provider. Document Released: 08/06/2011 Document Revised: 05/01/2016 Document Reviewed: 03/21/2015 Elsevier Interactive Patient Education  2017 [employer] Inc[employer]     

## 2017-02-25 NOTE — MAU Provider Note (Signed)
History     CSN: [id number]  Arrival date and time: 02/25/17 1309   None     Chief Complaint  Patient presents with  . Contractions   HPI   Jasmine Taylor is a 21 y.o. female [phone] @ 5838w4d here in MAU with contraction pain that started last night around 11:00 PM. She is feeling the contractions every 4-5 mins. No history of preterm delivery. [facility] and says that the NP there checked her cervix. She goes there everyday from 10-2 from class. She started hurting so bad while there so they checked her cervix. The pain Is located at the top of her abdomen. In the center. She has been eating a large amount of spicy foods.   OB History    Gravida Para Term Preterm AB Living   4 1 1   2 1    SAB TAB Ectopic Multiple Live Births   2       1      Past Medical History:  Diagnosis Date  . ADHD (attention deficit hyperactivity disorder)   . Anemia   . Anxiety   . Depression   . Pregnancy induced hypertension     Past Surgical History:  Procedure Laterality Date  . cesarean    . CESAREAN SECTION      No family history on file.  Social History  Substance Use Topics  . Smoking status: Current Every Day Smoker    Packs/day: 0.50    Types: Cigarettes  . Smokeless tobacco: Never Used  . Alcohol use No    Allergies:  Allergies  Allergen Reactions  . Influenza A (H1n1) Monovalent Vaccine Swelling    Prescriptions Prior to Admission  Medication Sig Dispense Refill Last Dose  . acetaminophen (TYLENOL) 500 MG tablet Take 1,000 mg by mouth every 6 (six) hours as needed for headache.   02/24/2017 at Unknown time  . aspirin EC 81 MG tablet Take 1 tablet (81 mg total) by mouth daily. 30 tablet 6 02/25/2017 at Unknown time  . calcium carbonate (TUMS - DOSED IN MG ELEMENTAL CALCIUM) 500 MG chewable tablet Chew 1 tablet by mouth daily as needed.    02/24/2017 at Unknown time  . ondansetron (ZOFRAN ODT) 4 MG disintegrating tablet Take 1  tablet (4 mg total) by mouth every 8 (eight) hours as needed for nausea or vomiting. 20 tablet 0 02/25/2017 at Unknown time  . pantoprazole (PROTONIX) 20 MG tablet Take 1 tablet (20 mg total) by mouth daily. 30 tablet 6 02/25/2017 at Unknown time  . Prenatal Vit-Fe Phos-FA-Omega (VITAFOL GUMMIES) 3.33-0.333-34.8 MG CHEW Chew 1 tablet by mouth daily. 90 tablet 8 02/25/2017 at Unknown time   Results for orders placed or performed during the hospital encounter of 02/25/17 (from the past 48 hour(s))  Wet prep, genital     Status: Abnormal   Collection Time: 02/25/17  2:36 PM  Result Value Ref Range   Yeast Wet Prep HPF POC NONE SEEN NONE SEEN   Trich, Wet Prep NONE SEEN NONE SEEN   Clue Cells Wet Prep HPF POC NONE SEEN NONE SEEN   WBC, Wet Prep HPF POC FEW (A) NONE SEEN    Comment: MANY BACTERIA SEEN   Sperm NONE SEEN    Review of Systems  Gastrointestinal: Positive for abdominal pain.   Physical Exam   Blood pressure 123/62, pulse 92, temperature 97.9 F (36.6 C), temperature source Oral, resp. rate 18, last menstrual period 09/08/2016,  unknown if currently breastfeeding.  Physical Exam  Constitutional: She is oriented to person, place, and time. She appears well-developed and well-nourished. No distress.  HENT:  Head: Normocephalic.  Eyes: Pupils are equal, round, and reactive to light.  GI: Soft. She exhibits no distension. There is tenderness in the epigastric area. There is no rebound and no guarding.  Genitourinary:  Genitourinary Comments: Cervix: closed, thick, posterior   Musculoskeletal: Normal range of motion.  Neurological: She is alert and oriented to person, place, and time.  Skin: Skin is warm. She is not diaphoretic.  Psychiatric: Her behavior is normal.    MAU Course  Procedures  None  MDM  Wet prep & GC + fetal heart tones via doppler    Assessment and Plan   A:  1. Abdominal pain in pregnancy, second trimester   2. Gastroesophageal reflux disease,  esophagitis presence not specified     P:  Discharge home in stable condition Increase protonix to 40 mg PO  Keep OB appointment Return to MAU if symptoms worsen  Bus passes given   Duane Lope, NP 02/25/2017 5:36 PM

## 2017-02-26 LAB — GC/CHLAMYDIA PROBE AMP (~~LOC~~) NOT AT ARMC
CHLAMYDIA, DNA PROBE: NEGATIVE
NEISSERIA GONORRHEA: NEGATIVE

## 2017-03-12 ENCOUNTER — Encounter: Payer: Medicaid Other | Admitting: Obstetrics & Gynecology

## 2017-03-20 ENCOUNTER — Ambulatory Visit (INDEPENDENT_AMBULATORY_CARE_PROVIDER_SITE_OTHER): Payer: Medicaid Other | Admitting: Certified Nurse Midwife

## 2017-03-20 ENCOUNTER — Encounter: Payer: Self-pay | Admitting: Certified Nurse Midwife

## 2017-03-20 ENCOUNTER — Encounter: Payer: Self-pay | Admitting: *Deleted

## 2017-03-20 VITALS — BP 128/81 | HR 106 | Wt 151.4 lb

## 2017-03-20 DIAGNOSIS — O09293 Supervision of pregnancy with other poor reproductive or obstetric history, third trimester: Secondary | ICD-10-CM

## 2017-03-20 DIAGNOSIS — O98413 Viral hepatitis complicating pregnancy, third trimester: Secondary | ICD-10-CM | POA: Diagnosis not present

## 2017-03-20 DIAGNOSIS — O0990 Supervision of high risk pregnancy, unspecified, unspecified trimester: Secondary | ICD-10-CM

## 2017-03-20 DIAGNOSIS — B182 Chronic viral hepatitis C: Secondary | ICD-10-CM | POA: Diagnosis not present

## 2017-03-20 DIAGNOSIS — Z87898 Personal history of other specified conditions: Secondary | ICD-10-CM

## 2017-03-20 DIAGNOSIS — O26899 Other specified pregnancy related conditions, unspecified trimester: Secondary | ICD-10-CM

## 2017-03-20 DIAGNOSIS — O34219 Maternal care for unspecified type scar from previous cesarean delivery: Secondary | ICD-10-CM

## 2017-03-20 DIAGNOSIS — O09299 Supervision of pregnancy with other poor reproductive or obstetric history, unspecified trimester: Secondary | ICD-10-CM

## 2017-03-20 DIAGNOSIS — O0993 Supervision of high risk pregnancy, unspecified, third trimester: Secondary | ICD-10-CM | POA: Diagnosis not present

## 2017-03-20 DIAGNOSIS — F1911 Other psychoactive substance abuse, in remission: Secondary | ICD-10-CM | POA: Insufficient documentation

## 2017-03-20 DIAGNOSIS — O98419 Viral hepatitis complicating pregnancy, unspecified trimester: Secondary | ICD-10-CM

## 2017-03-20 DIAGNOSIS — Z6791 Unspecified blood type, Rh negative: Secondary | ICD-10-CM

## 2017-03-20 HISTORY — DX: Chronic viral hepatitis C: B18.2

## 2017-03-20 NOTE — Progress Notes (Signed)
   PRENATAL VISIT NOTE  Subjective:  Jasmine Taylor is a 21 y.o. [phone] at 58w6dbeing seen today for ongoing prenatal care.  She is currently monitored for the following issues for this high-risk pregnancy and has Supervision of high risk pregnancy, antepartum; Hx of preeclampsia, prior pregnancy, currently pregnant; Previous cesarean delivery, antepartum; Bipolar disorder ([place]; Rh negative, antepartum; Pregnancy complicated by chronic hepatitis C, antepartum ([place]; and Hx of drug abuse on her problem list.  Patient reports no complaints.  Contractions: Not present. Vag. Bleeding: None.  Movement: Present. Denies leaking of fluid.   The following portions of the patient's history were reviewed and updated as appropriate: allergies, current medications, past family history, past medical history, past social history, past surgical history and problem list. Problem list updated.  Objective:   Vitals:   03/20/17 1106  BP: 128/81  Pulse: (!) 106  Weight: 151 lb 6.4 oz (68.7 kg)    Fetal Status: Fetal Heart Rate (bpm): 140 Fundal Height: 23 cm Movement: Present     General:  Alert, oriented and cooperative. Patient is in no acute distress.  Skin: Skin is warm and dry. No rash noted.   Cardiovascular: Normal heart rate noted  Respiratory: Normal respiratory effort, no problems with respiration noted  Abdomen: Soft, gravid, appropriate for gestational age. Pain/Pressure: Absent     Pelvic:  Cervical exam deferred        Extremities: Normal range of motion.  Edema: None  Mental Status: Normal mood and affect. Normal behavior. Normal judgment and thought content.   Assessment and Plan:  Pregnancy: G4P1021 at 240w6d1. Supervision of high risk pregnancy, antepartum     New dx of Hep C discussed: HD records in media file.  OB lab profile completed.  - Ambulatory referral to Infectious Disease - Rubella screen - Hemoglobinopathy evaluation - Hemoglobin A1c - Cystic Fibrosis  Mutation 97 - Varicella zoster antibody, IgG - Obstetric Panel, Including HIV  2. Hx of preeclampsia, prior pregnancy, currently pregnant     Taking baby ASA  3. Rh negative, antepartum     Rhogam '@28'$  wks  4. Pregnancy complicated by chronic hepatitis C, antepartum ([place]     - Ambulatory referral to Infectious Disease - Comp Met (CMET) - Bile acids, total - Hepatic function panel - Rubella screen  5. Previous cesarean delivery, antepartum     Patient desires TOLAC, [place] requested.   6. Hx of drug abuse     Both patient and partner, partner also has Hepatitis C  Preterm labor symptoms and general obstetric precautions including but not limited to vaginal bleeding, contractions, leaking of fluid and fetal movement were reviewed in detail with the patient. Please refer to After Visit Summary for other counseling recommendations.  [facility] (around 04[facility]17/2017[facility]  RaMorene CrockerCNM

## 2017-03-20 NOTE — Progress Notes (Signed)
Pt presents for ROB. Pt's partner has questions regarding VBAC. Pt recently diagnosed with HCV and HCV RNA on 02/24/17 by [facility] and was told she needed medication prior to delivery.

## 2017-03-27 LAB — COMPREHENSIVE METABOLIC PANEL
A/G RATIO: 1.4 (ref 1.2–2.2)
ALBUMIN: 3.5 g/dL (ref 3.5–5.5)
ALT: 29 IU/L (ref 0–32)
AST: 25 IU/L (ref 0–40)
Alkaline Phosphatase: 65 IU/L (ref 39–117)
BUN / CREAT RATIO: 20 (ref 9–23)
BUN: 9 mg/dL (ref 6–20)
Bilirubin Total: <0.2 mg/dL (ref 0.0–1.2)
CALCIUM: 8.8 mg/dL (ref 8.7–10.2)
CO2: 21 mmol/L (ref 18–29)
CREATININE: 0.44 mg/dL — AB (ref 0.57–1.00)
Chloride: 103 mmol/L (ref 96–106)
GFR calc Af Amer: 168 mL/min/{1.73_m2} (ref >59)
GFR calc non Af Amer: 146 mL/min/{1.73_m2} (ref >59)
GLOBULIN, TOTAL: 2.5 g/dL (ref 1.5–4.5)
Glucose: 64 mg/dL — ABNORMAL LOW (ref 65–99)
POTASSIUM: 4.3 mmol/L (ref 3.5–5.2)
SODIUM: 139 mmol/L (ref 134–144)
Total Protein: 6 g/dL (ref 6.0–8.5)

## 2017-03-27 LAB — OBSTETRIC PANEL, INCLUDING HIV
ANTIBODY SCREEN: NEGATIVE
BASOS: 0 %
Basophils Absolute: 0 10*3/uL (ref 0.0–0.2)
EOS (ABSOLUTE): 0 10*3/uL (ref 0.0–0.4)
EOS: 0 %
HEMATOCRIT: 36.5 % (ref 34.0–46.6)
HEMOGLOBIN: 11.7 g/dL (ref 11.1–15.9)
HIV Screen 4th Generation wRfx: NONREACTIVE
Hepatitis B Surface Ag: NEGATIVE
IMMATURE GRANS (ABS): 0.1 10*3/uL (ref 0.0–0.1)
Immature Granulocytes: 1 %
LYMPHS ABS: 1.7 10*3/uL (ref 0.7–3.1)
LYMPHS: 14 %
MCH: 30 pg (ref 26.6–33.0)
MCHC: 32.1 g/dL (ref 31.5–35.7)
MCV: 94 fL (ref 79–97)
MONOS ABS: 0.6 10*3/uL (ref 0.1–0.9)
Monocytes: 5 %
NEUTROS ABS: 9.4 10*3/uL — AB (ref 1.4–7.0)
Neutrophils: 80 %
Platelets: 277 10*3/uL (ref 150–379)
RBC: 3.9 x10E6/uL (ref 3.77–5.28)
RDW: 14.2 % (ref 12.3–15.4)
RH TYPE: NEGATIVE
RPR Ser Ql: NONREACTIVE
Rubella Antibodies, IGG: 1.12 index (ref >0.99)
WBC: 11.8 10*3/uL — ABNORMAL HIGH (ref 3.4–10.8)

## 2017-03-27 LAB — CYSTIC FIBROSIS MUTATION 97: Interpretation: NOT DETECTED

## 2017-03-27 LAB — HEPATIC FUNCTION PANEL: Bilirubin, Direct: 0.06 mg/dL (ref 0.00–0.40)

## 2017-03-27 LAB — BILE ACIDS, TOTAL: Bile Acids Total: 9 umol/L (ref 4.7–24.5)

## 2017-03-27 LAB — HEMOGLOBINOPATHY EVALUATION
HEMOGLOBIN A2 QUANTITATION: 2.5 % (ref 1.8–3.2)
HGB C: 0 %
HGB S: 0 %
HGB VARIANT: 0 %
Hemoglobin F Quantitation: 0 % (ref 0.0–2.0)
Hgb A: 97.5 % (ref 96.4–98.8)

## 2017-03-27 LAB — HEMOGLOBIN A1C
Est. average glucose Bld gHb Est-mCnc: 80 mg/dL
Hgb A1c MFr Bld: 4.4 % — ABNORMAL LOW (ref 4.8–5.6)

## 2017-03-27 LAB — VARICELLA ZOSTER ANTIBODY, IGG: Varicella zoster IgG: 237 index (ref >165)

## 2017-03-30 ENCOUNTER — Other Ambulatory Visit: Payer: Self-pay | Admitting: Certified Nurse Midwife

## 2017-03-30 DIAGNOSIS — O0990 Supervision of high risk pregnancy, unspecified, unspecified trimester: Secondary | ICD-10-CM

## 2017-04-17 ENCOUNTER — Encounter: Payer: Medicaid Other | Admitting: Certified Nurse Midwife

## 2017-04-20 ENCOUNTER — Encounter: Payer: Self-pay | Admitting: Obstetrics

## 2017-04-20 ENCOUNTER — Encounter: Payer: Self-pay | Admitting: *Deleted

## 2017-04-20 ENCOUNTER — Other Ambulatory Visit (HOSPITAL_COMMUNITY)
Admission: RE | Admit: 2017-04-20 | Discharge: 2017-04-20 | Disposition: A | Discharge status: Home / Self Care | Payer: Medicaid Other | Source: Ambulatory Visit | Attending: Certified Nurse Midwife | Admitting: Certified Nurse Midwife

## 2017-04-20 ENCOUNTER — Ambulatory Visit (INDEPENDENT_AMBULATORY_CARE_PROVIDER_SITE_OTHER): Payer: Medicaid Other | Admitting: Obstetrics and Gynecology

## 2017-04-20 VITALS — BP 127/81 | HR 98 | Wt 155.2 lb

## 2017-04-20 DIAGNOSIS — O98419 Viral hepatitis complicating pregnancy, unspecified trimester: Secondary | ICD-10-CM

## 2017-04-20 DIAGNOSIS — O98413 Viral hepatitis complicating pregnancy, third trimester: Secondary | ICD-10-CM | POA: Diagnosis not present

## 2017-04-20 DIAGNOSIS — O09293 Supervision of pregnancy with other poor reproductive or obstetric history, third trimester: Secondary | ICD-10-CM

## 2017-04-20 DIAGNOSIS — F3170 Bipolar disorder, currently in remission, most recent episode unspecified: Secondary | ICD-10-CM

## 2017-04-20 DIAGNOSIS — N898 Other specified noninflammatory disorders of vagina: Secondary | ICD-10-CM | POA: Insufficient documentation

## 2017-04-20 DIAGNOSIS — O09299 Supervision of pregnancy with other poor reproductive or obstetric history, unspecified trimester: Secondary | ICD-10-CM

## 2017-04-20 DIAGNOSIS — O99830 Other infection carrier state complicating pregnancy: Secondary | ICD-10-CM

## 2017-04-20 DIAGNOSIS — O34219 Maternal care for unspecified type scar from previous cesarean delivery: Secondary | ICD-10-CM | POA: Diagnosis not present

## 2017-04-20 DIAGNOSIS — B182 Chronic viral hepatitis C: Secondary | ICD-10-CM

## 2017-04-20 DIAGNOSIS — O0993 Supervision of high risk pregnancy, unspecified, third trimester: Secondary | ICD-10-CM

## 2017-04-20 DIAGNOSIS — O0990 Supervision of high risk pregnancy, unspecified, unspecified trimester: Secondary | ICD-10-CM

## 2017-04-20 LAB — POCT FERNING: FERNING, POC: NEGATIVE

## 2017-04-20 NOTE — Progress Notes (Signed)
   PRENATAL VISIT NOTE  Subjective:  Jasmine Taylor is a 21 y.o. [phone] at 376w2d being seen today for ongoing prenatal care.  She is currently monitored for the following issues for this high-risk pregnancy and has Supervision of high risk pregnancy, antepartum; Hx of preeclampsia, prior pregnancy, currently pregnant; Previous cesarean delivery, antepartum; Bipolar disorder (HCC); Rh negative, antepartum; Pregnancy complicated by chronic hepatitis C, antepartum (HCC); and Hx of drug abuse on her problem list.  Patient reports clear fluid per vagina for 1 week.  Contractions: Not present. Vag. Bleeding: None.  Movement: Present. Denies leaking of fluid.   The following portions of the patient's history were reviewed and updated as appropriate: allergies, current medications, past family history, past medical history, past social history, past surgical history and problem list. Problem list updated.  Objective:   Vitals:   04/20/17 1456  BP: 127/81  Pulse: 98  Weight: 155 lb 3.2 oz (70.4 kg)    Fetal Status: Fetal Heart Rate (bpm): 132 Fundal Height: 28 cm Movement: Present     General:  Alert, oriented and cooperative. Patient is in no acute distress.  Skin: Skin is warm and dry. No rash noted.   Cardiovascular: Normal heart rate noted  Respiratory: Normal respiratory effort, no problems with respiration noted  Abdomen: Soft, gravid, appropriate for gestational age. Pain/Pressure: Absent     Pelvic:  Cervical exam deferred        Extremities: Normal range of motion.  Edema: None  Mental Status: Normal mood and affect. Normal behavior. Normal judgment and thought content.   Assessment and Plan:  Pregnancy: G4P1021 at 3976w2d  1. Supervision of high risk pregnancy, antepartum Patient is doing well without complaints Patient due for 2 hr glucola but is not currently fasting. Patient to return for 2 hr test prior to her next appointment Patient is also Rh neg and is in need of  rhogam. Rhogam not available today and patient is to receive it at the time of her 2 hr glucola Follow up ultrasound on 5/25 to address fetal UTD  2. Clear vaginal discharge SSE: thin clear discharge without odor Fern negative, nitrazine negative Wet prep collected - POCT [place] - Cervicovaginal ancillary only  3. Pregnancy complicated by chronic hepatitis C, antepartum (HCC)   4. Previous cesarean delivery, antepartum Patient desires TOLAC. Consent signed  5. Bipolar affective disorder in remission (HCC)   6. Hx of preeclampsia, prior pregnancy, currently pregnant Continue ASA  Preterm labor symptoms and general obstetric precautions including but not limited to vaginal bleeding, contractions, leaking of fluid and fetal movement were reviewed in detail with the patient. Please refer to After Visit Summary for other counseling recommendations.  Return in about 2 weeks (around 05/04/2017) for ROB.   Aryka Coonradt, Gigi GinPeggy, MD

## 2017-04-20 NOTE — Progress Notes (Signed)
Pt c/o clear leaking fluid x 1 wk. 2 gtt labs due - pt not fasting.

## 2017-04-21 LAB — CERVICOVAGINAL ANCILLARY ONLY
BACTERIAL VAGINITIS: NEGATIVE
Candida vaginitis: POSITIVE — AB
Chlamydia: NEGATIVE
Neisseria Gonorrhea: NEGATIVE
Trichomonas: POSITIVE — AB

## 2017-04-23 ENCOUNTER — Other Ambulatory Visit: Payer: Medicaid Other

## 2017-04-23 ENCOUNTER — Other Ambulatory Visit: Payer: Self-pay | Admitting: Obstetrics and Gynecology

## 2017-04-23 MED ORDER — FLUCONAZOLE 150 MG PO TABS: 150.0000 mg | ORAL_TABLET | Freq: Once | ORAL | 0 refills | Status: AC

## 2017-04-23 MED ORDER — METRONIDAZOLE 500 MG PO TABS: 500.0000 mg | ORAL_TABLET | Freq: Two times a day (BID) | ORAL | 0 refills | Status: DC

## 2017-04-24 ENCOUNTER — Telehealth: Payer: Self-pay

## 2017-04-24 NOTE — Telephone Encounter (Signed)
-----   Message from Catalina AntiguaPeggy Constant, MD sent at 04/23/2017 10:33 AM EDT ----- Please inform the patient of both yeast and trich infection. Jasmine Taylor is an STD. Her partner needs to be informed and treated. They should both abstain until 7-day treatment course has been completed. Rx has been e-prescribed. Her partner can get treatment through PCP or health department  Peggy

## 2017-04-24 NOTE — Telephone Encounter (Signed)
Phone number is out of service

## 2017-04-27 ENCOUNTER — Other Ambulatory Visit: Payer: Medicaid Other

## 2017-04-27 DIAGNOSIS — O0990 Supervision of high risk pregnancy, unspecified, unspecified trimester: Secondary | ICD-10-CM

## 2017-04-27 NOTE — Telephone Encounter (Signed)
Another attempt to call pt. Number out of service. Letter mailed for pt to contact the office.

## 2017-04-28 ENCOUNTER — Other Ambulatory Visit: Payer: Self-pay | Admitting: Obstetrics

## 2017-04-28 DIAGNOSIS — B373 Candidiasis of vulva and vagina: Secondary | ICD-10-CM

## 2017-04-28 DIAGNOSIS — A5901 Trichomonal vulvovaginitis: Secondary | ICD-10-CM

## 2017-04-28 DIAGNOSIS — B3731 Acute candidiasis of vulva and vagina: Secondary | ICD-10-CM

## 2017-04-28 LAB — CBC
Hematocrit: 37.3 % (ref 34.0–46.6)
Hemoglobin: 12.4 g/dL (ref 11.1–15.9)
MCH: 31.2 pg (ref 26.6–33.0)
MCHC: 33.2 g/dL (ref 31.5–35.7)
MCV: 94 fL (ref 79–97)
PLATELETS: 286 10*3/uL (ref 150–379)
RBC: 3.97 x10E6/uL (ref 3.77–5.28)
RDW: 14.1 % (ref 12.3–15.4)
WBC: 12.4 10*3/uL — ABNORMAL HIGH (ref 3.4–10.8)

## 2017-04-28 LAB — GLUCOSE TOLERANCE, 2 HOURS W/ 1HR
GLUCOSE, 1 HOUR: 183 mg/dL — AB (ref 65–179)
GLUCOSE, FASTING: 76 mg/dL (ref 65–91)
Glucose, 2 hour: 118 mg/dL (ref 65–152)

## 2017-04-28 LAB — HIV ANTIBODY (ROUTINE TESTING W REFLEX): HIV Screen 4th Generation wRfx: NONREACTIVE

## 2017-04-28 LAB — RPR: RPR Ser Ql: NONREACTIVE

## 2017-04-28 MED ORDER — TINIDAZOLE 500 MG PO TABS: 2.0000 g | ORAL_TABLET | Freq: Once | ORAL | 0 refills | Status: AC

## 2017-04-28 MED ORDER — FLUCONAZOLE 150 MG PO TABS
150.0000 mg | ORAL_TABLET | Freq: Once | ORAL | 0 refills | Status: AC
Start: 2017-04-28 — End: 2017-04-28

## 2017-05-01 ENCOUNTER — Ambulatory Visit (HOSPITAL_COMMUNITY)
Admission: RE | Admit: 2017-05-01 | Discharge: 2017-05-01 | Disposition: A | Discharge status: Home / Self Care | Payer: Medicaid Other | Source: Ambulatory Visit | Attending: Obstetrics & Gynecology | Admitting: Obstetrics & Gynecology

## 2017-05-01 ENCOUNTER — Encounter (HOSPITAL_COMMUNITY): Payer: Self-pay

## 2017-05-01 DIAGNOSIS — O358XX0 Maternal care for other (suspected) fetal abnormality and damage, not applicable or unspecified: Secondary | ICD-10-CM

## 2017-05-01 DIAGNOSIS — Z6791 Unspecified blood type, Rh negative: Secondary | ICD-10-CM

## 2017-05-01 DIAGNOSIS — O35EXX0 Maternal care for other (suspected) fetal abnormality and damage, fetal genitourinary anomalies, not applicable or unspecified: Secondary | ICD-10-CM

## 2017-05-01 DIAGNOSIS — Z3A29 29 weeks gestation of pregnancy: Secondary | ICD-10-CM | POA: Diagnosis not present

## 2017-05-01 DIAGNOSIS — O358XX3 Maternal care for other (suspected) fetal abnormality and damage, fetus 3: Secondary | ICD-10-CM | POA: Insufficient documentation

## 2017-05-01 DIAGNOSIS — O26899 Other specified pregnancy related conditions, unspecified trimester: Secondary | ICD-10-CM

## 2017-05-01 NOTE — Addendum Note (Signed)
Encounter addended by: Lenoard AdenJohnson, Aerica Rincon M, RT on: 05/01/2017 11:42 AM<BR>    Actions taken: Imaging Exam ended

## 2017-05-07 ENCOUNTER — Ambulatory Visit (INDEPENDENT_AMBULATORY_CARE_PROVIDER_SITE_OTHER): Payer: Medicaid Other | Admitting: Obstetrics and Gynecology

## 2017-05-07 VITALS — BP 119/76 | HR 98 | Wt 152.0 lb

## 2017-05-07 DIAGNOSIS — O09893 Supervision of other high risk pregnancies, third trimester: Secondary | ICD-10-CM | POA: Diagnosis not present

## 2017-05-07 DIAGNOSIS — O24419 Gestational diabetes mellitus in pregnancy, unspecified control: Secondary | ICD-10-CM

## 2017-05-07 DIAGNOSIS — O09299 Supervision of pregnancy with other poor reproductive or obstetric history, unspecified trimester: Secondary | ICD-10-CM | POA: Insufficient documentation

## 2017-05-07 DIAGNOSIS — O34219 Maternal care for unspecified type scar from previous cesarean delivery: Secondary | ICD-10-CM

## 2017-05-07 DIAGNOSIS — O98413 Viral hepatitis complicating pregnancy, third trimester: Secondary | ICD-10-CM

## 2017-05-07 DIAGNOSIS — B182 Chronic viral hepatitis C: Secondary | ICD-10-CM | POA: Diagnosis not present

## 2017-05-07 DIAGNOSIS — O24913 Unspecified diabetes mellitus in pregnancy, third trimester: Secondary | ICD-10-CM | POA: Diagnosis not present

## 2017-05-07 DIAGNOSIS — O98419 Viral hepatitis complicating pregnancy, unspecified trimester: Secondary | ICD-10-CM

## 2017-05-07 DIAGNOSIS — O26899 Other specified pregnancy related conditions, unspecified trimester: Principal | ICD-10-CM

## 2017-05-07 DIAGNOSIS — O0990 Supervision of high risk pregnancy, unspecified, unspecified trimester: Secondary | ICD-10-CM

## 2017-05-07 DIAGNOSIS — Z6791 Unspecified blood type, Rh negative: Secondary | ICD-10-CM | POA: Diagnosis not present

## 2017-05-07 DIAGNOSIS — Z8632 Personal history of gestational diabetes: Secondary | ICD-10-CM

## 2017-05-07 HISTORY — DX: Supervision of pregnancy with other poor reproductive or obstetric history, unspecified trimester: O09.299

## 2017-05-07 MED ORDER — ACCU-CHEK NANO SMARTVIEW W/DEVICE KIT: 1.0000 | PACK | 0 refills | Status: DC

## 2017-05-07 MED ORDER — GLUCOSE BLOOD VI STRP: ORAL_STRIP | 12 refills | Status: DC

## 2017-05-07 MED ORDER — ACCU-CHEK FASTCLIX LANCETS MISC: 1.0000 [IU] | Freq: Four times a day (QID) | 12 refills | Status: DC

## 2017-05-07 NOTE — Progress Notes (Signed)
   PRENATAL VISIT NOTE  Subjective:  Jasmine Taylor is a 21 y.o. [phone] at 3341w5d being seen today for ongoing prenatal care.  She is currently monitored for the following issues for this high-risk pregnancy and has Supervision of high risk pregnancy, antepartum; Hx of preeclampsia, prior pregnancy, currently pregnant; Previous cesarean delivery, antepartum; Bipolar disorder (HCC); Rh negative, antepartum; Pregnancy complicated by chronic hepatitis C, antepartum (HCC); Hx of drug abuse; and Gestational diabetes mellitus (GDM) affecting pregnancy, antepartum on her problem list.  Patient reports no complaints.  Contractions: Irregular. Vag. Bleeding: None.  Movement: Present. Denies leaking of fluid.   The following portions of the patient's history were reviewed and updated as appropriate: allergies, current medications, past family history, past medical history, past social history, past surgical history and problem list. Problem list updated.  Objective:   Vitals:   05/07/17 0817  BP: 119/76  Pulse: 98  Weight: 152 lb (68.9 kg)    Fetal Status: Fetal Heart Rate (bpm): 141 Fundal Height: 31 cm Movement: Present     General:  Alert, oriented and cooperative. Patient is in no acute distress.  Skin: Skin is warm and dry. No rash noted.   Cardiovascular: Normal heart rate noted  Respiratory: Normal respiratory effort, no problems with respiration noted  Abdomen: Soft, gravid, appropriate for gestational age. Pain/Pressure: Absent     Pelvic:  Cervical exam deferred        Extremities: Normal range of motion.  Edema: Trace  Mental Status: Normal mood and affect. Normal behavior. Normal judgment and thought content.   Assessment and Plan:  Pregnancy: G4P1021 at 5241w5d  1. Rh negative, antepartum S/p rhogam  2. Supervision of high risk pregnancy, antepartum Patient is doing well without complaints Patient is still trying to quit smoking- smoking cessation resources provided  3.  Pregnancy complicated by chronic hepatitis C, antepartum (HCC)   4. Previous cesarean delivery, antepartum Patient desires to TOLAC  5. Gestational diabetes mellitus (GDM) affecting pregnancy, antepartum Patient informed of abnormal results. Discussed maternal/fetal implication of poorly controlled diabetes including but not limited to risks of fetal macrosomia, shoulder dystocia, need for cesarean section, neonatal hypoglycemia, neonatal seizures, IUFD and neonatal death Patient verbalized understanding.  - Referral to Nutrition and Diabetes Services  Preterm labor symptoms and general obstetric precautions including but not limited to vaginal bleeding, contractions, leaking of fluid and fetal movement were reviewed in detail with the patient. Please refer to After Visit Summary for other counseling recommendations.  No Follow-up on file.   Catalina AntiguaPeggy Rakisha Pincock, MD

## 2017-05-20 ENCOUNTER — Ambulatory Visit: Payer: Medicaid Other | Admitting: Registered"

## 2017-05-21 ENCOUNTER — Encounter: Payer: Medicaid Other | Admitting: Obstetrics and Gynecology

## 2017-05-27 ENCOUNTER — Ambulatory Visit: Payer: Medicaid Other | Admitting: Registered"

## 2017-06-04 ENCOUNTER — Encounter: Payer: Medicaid Other | Admitting: Obstetrics and Gynecology

## 2017-06-18 ENCOUNTER — Ambulatory Visit (INDEPENDENT_AMBULATORY_CARE_PROVIDER_SITE_OTHER): Payer: Medicaid Other | Admitting: Obstetrics & Gynecology

## 2017-06-18 ENCOUNTER — Encounter (HOSPITAL_COMMUNITY): Payer: Self-pay

## 2017-06-18 ENCOUNTER — Inpatient Hospital Stay (HOSPITAL_COMMUNITY)
Admission: AD | Admit: 2017-06-18 | Discharge: 2017-06-18 | Disposition: A | Discharge status: Home / Self Care | Payer: Medicaid Other | Source: Ambulatory Visit | Attending: Obstetrics & Gynecology | Admitting: Obstetrics & Gynecology

## 2017-06-18 VITALS — BP 143/85 | HR 95 | Wt 155.6 lb

## 2017-06-18 DIAGNOSIS — O24913 Unspecified diabetes mellitus in pregnancy, third trimester: Secondary | ICD-10-CM | POA: Diagnosis not present

## 2017-06-18 DIAGNOSIS — F909 Attention-deficit hyperactivity disorder, unspecified type: Secondary | ICD-10-CM | POA: Diagnosis not present

## 2017-06-18 DIAGNOSIS — Z6791 Unspecified blood type, Rh negative: Secondary | ICD-10-CM

## 2017-06-18 DIAGNOSIS — O26899 Other specified pregnancy related conditions, unspecified trimester: Secondary | ICD-10-CM

## 2017-06-18 DIAGNOSIS — O99343 Other mental disorders complicating pregnancy, third trimester: Secondary | ICD-10-CM | POA: Diagnosis not present

## 2017-06-18 DIAGNOSIS — O09299 Supervision of pregnancy with other poor reproductive or obstetric history, unspecified trimester: Secondary | ICD-10-CM

## 2017-06-18 DIAGNOSIS — Z3A36 36 weeks gestation of pregnancy: Secondary | ICD-10-CM | POA: Diagnosis not present

## 2017-06-18 DIAGNOSIS — Z7982 Long term (current) use of aspirin: Secondary | ICD-10-CM | POA: Diagnosis not present

## 2017-06-18 DIAGNOSIS — F419 Anxiety disorder, unspecified: Secondary | ICD-10-CM | POA: Diagnosis not present

## 2017-06-18 DIAGNOSIS — O0990 Supervision of high risk pregnancy, unspecified, unspecified trimester: Secondary | ICD-10-CM

## 2017-06-18 DIAGNOSIS — Z3689 Encounter for other specified antenatal screening: Secondary | ICD-10-CM | POA: Diagnosis not present

## 2017-06-18 DIAGNOSIS — O163 Unspecified maternal hypertension, third trimester: Secondary | ICD-10-CM | POA: Insufficient documentation

## 2017-06-18 DIAGNOSIS — F329 Major depressive disorder, single episode, unspecified: Secondary | ICD-10-CM | POA: Insufficient documentation

## 2017-06-18 DIAGNOSIS — O0993 Supervision of high risk pregnancy, unspecified, third trimester: Secondary | ICD-10-CM | POA: Diagnosis not present

## 2017-06-18 DIAGNOSIS — O99333 Smoking (tobacco) complicating pregnancy, third trimester: Secondary | ICD-10-CM | POA: Diagnosis not present

## 2017-06-18 DIAGNOSIS — O09293 Supervision of pregnancy with other poor reproductive or obstetric history, third trimester: Secondary | ICD-10-CM | POA: Diagnosis not present

## 2017-06-18 DIAGNOSIS — Z79899 Other long term (current) drug therapy: Secondary | ICD-10-CM | POA: Insufficient documentation

## 2017-06-18 DIAGNOSIS — O24419 Gestational diabetes mellitus in pregnancy, unspecified control: Secondary | ICD-10-CM

## 2017-06-18 LAB — COMPREHENSIVE METABOLIC PANEL
ALK PHOS: 153 U/L — AB (ref 38–126)
ALT: 18 U/L (ref 14–54)
AST: 27 U/L (ref 15–41)
Albumin: 2.8 g/dL — ABNORMAL LOW (ref 3.5–5.0)
Anion gap: 6 (ref 5–15)
BUN: 6 mg/dL (ref 6–20)
CALCIUM: 8.7 mg/dL — AB (ref 8.9–10.3)
CO2: 23 mmol/L (ref 22–32)
CREATININE: 0.75 mg/dL (ref 0.44–1.00)
Chloride: 108 mmol/L (ref 101–111)
Glucose, Bld: 91 mg/dL (ref 65–99)
Potassium: 3.2 mmol/L — ABNORMAL LOW (ref 3.5–5.1)
Sodium: 137 mmol/L (ref 135–145)
TOTAL PROTEIN: 6.2 g/dL — AB (ref 6.5–8.1)
Total Bilirubin: 0.4 mg/dL (ref 0.3–1.2)

## 2017-06-18 LAB — URINALYSIS, ROUTINE W REFLEX MICROSCOPIC
BILIRUBIN URINE: NEGATIVE
Glucose, UA: NEGATIVE mg/dL
KETONES UR: NEGATIVE mg/dL
Nitrite: NEGATIVE
PROTEIN: NEGATIVE mg/dL
Specific Gravity, Urine: 1.008 (ref 1.005–1.030)
pH: 6 (ref 5.0–8.0)

## 2017-06-18 LAB — CBC
HCT: 34.9 % — ABNORMAL LOW (ref 36.0–46.0)
Hemoglobin: 12.1 g/dL (ref 12.0–15.0)
MCH: 31.2 pg (ref 26.0–34.0)
MCHC: 34.7 g/dL (ref 30.0–36.0)
MCV: 89.9 fL (ref 78.0–100.0)
Platelets: 166 10*3/uL (ref 150–400)
RBC: 3.88 MIL/uL (ref 3.87–5.11)
RDW: 14.8 % (ref 11.5–15.5)
WBC: 10.8 10*3/uL — ABNORMAL HIGH (ref 4.0–10.5)

## 2017-06-18 LAB — PROTEIN / CREATININE RATIO, URINE
CREATININE, URINE: 79 mg/dL
Protein Creatinine Ratio: 0.15 mg/mg{Cre} (ref 0.00–0.15)
TOTAL PROTEIN, URINE: 12 mg/dL

## 2017-06-18 NOTE — MAU Note (Signed)
Pt sent over from office for elevated B/Ps. Denies any H/A, visual changes or pain. Good fetal movement reported.

## 2017-06-18 NOTE — MAU Provider Note (Signed)
History     CSN: [id number]  Arrival date and time: 06/18/17 1353   First Provider Initiated Contact with Patient 06/18/17 1504      Chief Complaint  Patient presents with  . Hypertension   G4P1021 @36 .5 weeks sent from office for elevated BP. Denies HA, visual disturbances, or epigastric pain. Reports good FM. No VB, LOF, or ctx. Pregnancy is complicated by hx of preeclampsia in previous pregnancy, +hep C, A1GDM, +THC, and hx of IV drug use. She had transportation issues therefore has not seen the diabetes educator and does not have a glucose meter since she didn't have her medicaid card when presenting to pick up from pharmacy.     OB History    Gravida Para Term Preterm AB Living   4 1 1  0 2 1   SAB TAB Ectopic Multiple Live Births   2 0 0 2 1      Past Medical History:  Diagnosis Date  . ADHD (attention deficit hyperactivity disorder)   . Anemia   . Anxiety   . Depression   . Pregnancy induced hypertension     Past Surgical History:  Procedure Laterality Date  . cesarean    . CESAREAN SECTION      No family history on file.  Social History  Substance Use Topics  . Smoking status: Current Every Day Smoker    Packs/day: 0.50    Types: Cigarettes  . Smokeless tobacco: Never Used  . Alcohol use No    Allergies:  Allergies  Allergen Reactions  . Influenza A (H1n1) Monovalent Vaccine Swelling    Prescriptions Prior to Admission  Medication Sig Dispense Refill Last Dose  . ACCU-CHEK FASTCLIX LANCETS MISC 1 Units by Percutaneous route 4 (four) times daily. 100 each 12   . acetaminophen (TYLENOL) 500 MG tablet Take 1,000 mg by mouth every 6 (six) hours as needed for headache.   Taking  . aspirin EC 81 MG tablet Take 1 tablet (81 mg total) by mouth daily. 30 tablet 6 Taking  . Blood Glucose Monitoring Suppl (ACCU-CHEK NANO SMARTVIEW) w/Device KIT 1 kit by Subdermal route as directed. Check blood sugars for fasting, and two hours after breakfast, lunch and  dinner (4 checks daily) 1 kit 0   . butalbital-acetaminophen-caffeine (FIORICET, ESGIC) 50-325-40 MG tablet TAKE ONE TABLET BY MOUTH TWICE DAILY AS NEEDED FOR HEADACHE  0 Taking  . calcium carbonate (TUMS - DOSED IN MG ELEMENTAL CALCIUM) 500 MG chewable tablet Chew 1 tablet by mouth daily as needed.    Taking  . glucose blood (ACCU-CHEK SMARTVIEW) test strip Use as instructed to check blood sugars 100 each 12   . metroNIDAZOLE (FLAGYL) 500 MG tablet Take 1 tablet (500 mg total) by mouth 2 (two) times daily. 14 tablet 0 Taking  . pantoprazole (PROTONIX) 20 MG tablet Take 2 tablets (40 mg total) by mouth daily. (Patient not taking: Reported on 03/20/2017) 30 tablet 6 Not Taking  . Prenatal Vit-Fe Phos-FA-Omega (VITAFOL GUMMIES) 3.33-0.333-34.8 MG CHEW Chew 1 tablet by mouth daily. 90 tablet 8 Taking    Review of Systems  Eyes: Negative for visual disturbance.  Gastrointestinal: Negative for abdominal pain.  Genitourinary: Negative for vaginal bleeding.  Neurological: Negative for headaches.   Physical Exam   Blood pressure (!) 145/89, pulse 79, temperature 98.2 F (36.8 C), temperature source Oral, resp. rate 18, height 4' 11"  (1.499 m), weight 155 lb (70.3 kg), last menstrual period 09/08/2016, unknown if currently breastfeeding.  Patient Vitals for  the past 24 hrs:  BP Temp Temp src Pulse Resp Height Weight  06/18/17 1546 (!) 147/96 - - 70 - - -  06/18/17 1530 (!) 143/89 - - 70 - - -  06/18/17 1516 129/77 - - 83 - - -  06/18/17 1500 (!) 145/89 - - 79 - - -  06/18/17 1450 (!) 139/92 - - 83 - - -  06/18/17 1430 (!) 141/80 98.2 F (36.8 C) Oral 78 18 4' 11"  (1.499 m) 155 lb (70.3 kg)    Physical Exam  Nursing note and vitals reviewed. Constitutional: She is oriented to person, place, and time. She appears well-developed and well-nourished.  HENT:  Head: Normocephalic and atraumatic.  Neck: Normal range of motion.  Cardiovascular: Normal rate.   Respiratory: Effort normal.  GI:  Soft. She exhibits no distension. There is no tenderness.  gravid  Musculoskeletal: Normal range of motion. She exhibits edema (trace to LE).  Neurological: She is alert and oriented to person, place, and time.  Skin: Skin is warm and dry.  Psychiatric: She has a normal mood and affect.   EFM: 135 bpm, mod variability, + accels, no decels Toco: rare  Results for orders placed or performed during the hospital encounter of 06/18/17 (from the past 24 hour(s))  Urinalysis, Routine w reflex microscopic     Status: Abnormal   Collection Time: 06/18/17  2:32 PM  Result Value Ref Range   Color, Urine YELLOW YELLOW   APPearance HAZY (A) CLEAR   Specific Gravity, Urine 1.008 1.005 - 1.030   pH 6.0 5.0 - 8.0   Glucose, UA NEGATIVE NEGATIVE mg/dL   Hgb urine dipstick SMALL (A) NEGATIVE   Bilirubin Urine NEGATIVE NEGATIVE   Ketones, ur NEGATIVE NEGATIVE mg/dL   Protein, ur NEGATIVE NEGATIVE mg/dL   Nitrite NEGATIVE NEGATIVE   Leukocytes, UA LARGE (A) NEGATIVE   RBC / HPF 6-30 0 - 5 RBC/hpf   WBC, UA 6-30 0 - 5 WBC/hpf   Bacteria, UA FEW (A) NONE SEEN   Squamous Epithelial / LPF 6-30 (A) NONE SEEN   Mucous PRESENT   Protein / creatinine ratio, urine     Status: None   Collection Time: 06/18/17  2:32 PM  Result Value Ref Range   Creatinine, Urine 79.00 mg/dL   Total Protein, Urine 12 mg/dL   Protein Creatinine Ratio 0.15 0.00 - 0.15 mg/mg[Cre]  CBC     Status: Abnormal   Collection Time: 06/18/17  2:50 PM  Result Value Ref Range   WBC 10.8 (H) 4.0 - 10.5 K/uL   RBC 3.88 3.87 - 5.11 MIL/uL   Hemoglobin 12.1 12.0 - 15.0 g/dL   HCT 34.9 (L) 36.0 - 46.0 %   MCV 89.9 78.0 - 100.0 fL   MCH 31.2 26.0 - 34.0 pg   MCHC 34.7 30.0 - 36.0 g/dL   RDW 14.8 11.5 - 15.5 %   Platelets 166 150 - 400 K/uL  Comprehensive metabolic panel     Status: Abnormal   Collection Time: 06/18/17  2:50 PM  Result Value Ref Range   Sodium 137 135 - 145 mmol/L   Potassium 3.2 (L) 3.5 - 5.1 mmol/L   Chloride 108  101 - 111 mmol/L   CO2 23 22 - 32 mmol/L   Glucose, Bld 91 65 - 99 mg/dL   BUN 6 6 - 20 mg/dL   Creatinine, Ser 0.75 0.44 - 1.00 mg/dL   Calcium 8.7 (L) 8.9 - 10.3 mg/dL   Total Protein  6.2 (L) 6.5 - 8.1 g/dL   Albumin 2.8 (L) 3.5 - 5.0 g/dL   AST 27 15 - 41 U/L   ALT 18 14 - 54 U/L   Alkaline Phosphatase 153 (H) 38 - 126 U/L   Total Bilirubin 0.4 0.3 - 1.2 mg/dL   GFR calc non Af Amer >60 >60 mL/min   GFR calc Af Amer >60 >60 mL/min   Anion gap 6 5 - 15   MAU Course  Procedures  MDM Labs ordered and reviewed. No evidence of pre-e. Will recheck BP on Monday, if elevated then IOL for gHTN. Preeclampsia precautions discussed. GC/CT & GBS collected. Stable for discharge home.  Assessment and Plan  [redacted] weeks gestation Reactive NST HTN in pregnancy  Discharge home Follow up at [facility] on Monday Pre-e/return precautions  Allergies as of 06/18/2017      Reactions   Influenza A (h1n1) Monovalent Vaccine Swelling      Medication List    STOP taking these medications   metroNIDAZOLE 500 MG tablet Commonly known as:  FLAGYL     TAKE these medications   ACCU-CHEK FASTCLIX LANCETS Misc 1 Units by Percutaneous route 4 (four) times daily.   ACCU-CHEK NANO SMARTVIEW w/Device Kit 1 kit by Subdermal route as directed. Check blood sugars for fasting, and two hours after breakfast, lunch and dinner (4 checks daily)   acetaminophen 500 MG tablet Commonly known as:  TYLENOL Take 1,000 mg by mouth every 6 (six) hours as needed for headache.   aspirin EC 81 MG tablet Take 1 tablet (81 mg total) by mouth daily.   butalbital-acetaminophen-caffeine 50-325-40 MG tablet Commonly known as:  FIORICET, ESGIC TAKE ONE TABLET BY MOUTH TWICE DAILY AS NEEDED FOR HEADACHE   calcium carbonate 500 MG chewable tablet Commonly known as:  TUMS - dosed in mg elemental calcium Chew 1 tablet by mouth daily as needed.   glucose blood test strip Commonly known as:  ACCU-CHEK SMARTVIEW Use as  instructed to check blood sugars   pantoprazole 20 MG tablet Commonly known as:  PROTONIX Take 2 tablets (40 mg total) by mouth daily.   VITAFOL GUMMIES 3.33-0.333-34.8 MG Chew Chew 1 tablet by mouth daily.      Julianne Handler, CNM 06/18/2017, 3:11 PM

## 2017-06-18 NOTE — Patient Instructions (Signed)
Return to clinic for any scheduled appointments or obstetric concerns, or go to MAU for evaluation  

## 2017-06-18 NOTE — Progress Notes (Signed)
   PRENATAL VISIT NOTE  Subjective:  Jasmine Taylor is a 21 y.o. [phone] at 6074w5d being seen today for ongoing prenatal care.  She is currently monitored for the following issues for this high-risk pregnancy and has Supervision of high risk pregnancy, antepartum; Hx of preeclampsia, prior pregnancy, currently pregnant; Previous cesarean delivery, antepartum; Bipolar disorder (HCC); Rh negative, antepartum; Pregnancy complicated by chronic hepatitis C, antepartum (HCC); Hx of drug abuse; and Gestational diabetes mellitus (GDM) affecting pregnancy, antepartum on her problem list.  Patient reports no complaints.  Contractions: Irregular. Vag. Bleeding: None.  Movement: Present. Denies leaking of fluid.   The following portions of the patient's history were reviewed and updated as appropriate: allergies, current medications, past family history, past medical history, past social history, past surgical history and problem list. Problem list updated.  Objective:   Vitals:   06/18/17 1620 06/18/17 1621  BP: (!) 149/91 (!) 143/85  Pulse: 95   Weight: 155 lb 9.6 oz (70.6 kg)     Fetal Status: Fetal Heart Rate (bpm): 140   Movement: Present     General:  Alert, oriented and cooperative. Patient is in no acute distress.  Skin: Skin is warm and dry. No rash noted.   Cardiovascular: Normal heart rate noted  Respiratory: Normal respiratory effort, no problems with respiration noted  Abdomen: Soft, gravid, appropriate for gestational age. Pain/Pressure: Absent     Pelvic:  Cervical exam deferred        Extremities: Normal range of motion.  Edema: Trace  Mental Status: Normal mood and affect. Normal behavior. Normal judgment and thought content.   Assessment and Plan:  Pregnancy: G4P1021 at 5874w5d  1. Elevated blood pressure affecting pregnancy in third trimester, antepartum 2. Hx of preeclampsia, prior pregnancy, currently pregnant Sent to MAU for further evaluation  3. Gestational  diabetes mellitus (GDM) affecting pregnancy, antepartum Not checking BS, will check labs in MAU  4. Supervision of high risk pregnancy, antepartum Preterm labor symptoms and general obstetric precautions including but not limited to vaginal bleeding, contractions, leaking of fluid and fetal movement were reviewed in detail with the patient. Please refer to After Visit Summary for other counseling recommendations.  Return in about 4 days (around 06/22/2017) for OB Visit and BP recheck.   Jaynie CollinsUgonna Feras Gardella, MD

## 2017-06-18 NOTE — Progress Notes (Addendum)
G4P1 @ 36.[redacted] wksga. [facility]. Serial BP initiated by charge nurse.   EFM applied by charge nurse.  [facility]   1505: Provider at bs assessing pt.   1555: up to bathroom   1607: GBS and GC collected and sent to lab.   1623: Discharge instructions given with pt understanding. Pt left unit via ambulatory with SO.

## 2017-06-18 NOTE — Discharge Instructions (Signed)
Hypertension During Pregnancy °Hypertension, commonly called high blood pressure, is when the force of blood pumping through your arteries is too strong. Arteries are blood vessels that carry blood from the heart throughout the body. Hypertension during pregnancy can cause problems for you and your baby. Your baby may be born early (prematurely) or may not weigh as much as he or she should at birth. Very bad cases of hypertension during pregnancy can be life-threatening. °Different types of hypertension can occur during pregnancy. These include: °· Chronic hypertension. This happens when: °? You have hypertension before pregnancy and it continues during pregnancy. °? You develop hypertension before you are [redacted] weeks pregnant, and it continues during pregnancy. °· Gestational hypertension. This is hypertension that develops after the 20th week of pregnancy. °· Preeclampsia, also called toxemia of pregnancy. This is a very serious type of hypertension that develops only during pregnancy. It affects the whole body, and it can be very dangerous for you and your baby. ° °Gestational hypertension and preeclampsia usually go away within 6 weeks after your baby is born. Women who have hypertension during pregnancy have a greater chance of developing hypertension later in life or during future pregnancies. °What are the causes? °The exact cause of hypertension is not known. °What increases the risk? °There are certain factors that make it more likely for you to develop hypertension during pregnancy. These include: °· Having hypertension during a previous pregnancy or prior to pregnancy. °· Being overweight. °· Being older than age 40. °· Being pregnant for the first time or being pregnant with more than one baby. °· Becoming pregnant using fertilization methods such as IVF (in vitro fertilization). °· Having diabetes, kidney problems, or systemic lupus erythematosus. °· Having a family history of hypertension. ° °What are the  signs or symptoms? °Chronic hypertension and gestational hypertension rarely cause symptoms. Preeclampsia causes symptoms, which may include: °· Increased protein in your urine. Your health care provider will check for this at every visit before you give birth (prenatal visit). °· Severe headaches. °· Sudden weight gain. °· Swelling of the hands, face, legs, and feet. °· Nausea and vomiting. °· Vision problems, such as blurred or double vision. °· Numbness in the face, arms, legs, and feet. °· Dizziness. °· Slurred speech. °· Sensitivity to bright lights. °· Abdominal pain. °· Convulsions. ° °How is this diagnosed? °You may be diagnosed with hypertension during a routine prenatal exam. At each prenatal visit, you may: °· Have a urine test to check for high amounts of protein in your urine. °· Have your blood pressure checked. A blood pressure reading is recorded as two numbers, such as "120 over 80" (or 120/80). The first ("top") number is called the systolic pressure. It is a measure of the pressure in your arteries when your heart beats. The second ("bottom") number is called the diastolic pressure. It is a measure of the pressure in your arteries as your heart relaxes between beats. Blood pressure is measured in a unit called mm Hg. A normal blood pressure reading is: °? Systolic: below 120. °? Diastolic: below 80. ° °The type of hypertension that you are diagnosed with depends on your test results and when your symptoms developed. °· Chronic hypertension is usually diagnosed before 20 weeks of pregnancy. °· Gestational hypertension is usually diagnosed after 20 weeks of pregnancy. °· Hypertension with high amounts of protein in the urine is diagnosed as preeclampsia. °· Blood pressure measurements that stay above 160 systolic, or above 110 diastolic, are   signs of severe preeclampsia. ° °How is this treated? °Treatment for hypertension during pregnancy varies depending on the type of hypertension you have and how  serious it is. °· If you take medicines called ACE inhibitors to treat chronic hypertension, you may need to switch medicines. ACE inhibitors should not be taken during pregnancy. °· If you have gestational hypertension, you may need to take blood pressure medicine. °· If you are at risk for preeclampsia, your health care provider may recommend that you take a low-dose aspirin every day to prevent high blood pressure during your pregnancy. °· If you have severe preeclampsia, you may need to be hospitalized so you and your baby can be monitored closely. You may also need to take medicine (magnesium sulfate) to prevent seizures and to lower blood pressure. This medicine may be given as an injection or through an IV tube. °· In some cases, if your condition gets worse, you may need to deliver your baby early. ° °Follow these instructions at home: °Eating and drinking °· Drink enough fluid to keep your urine clear or pale yellow. °· Eat a healthy diet that is low in salt (sodium). Do not add salt to your food. Check food labels to see how much sodium a food or beverage contains. °Lifestyle °· Do not use any products that contain nicotine or tobacco, such as cigarettes and e-cigarettes. If you need help quitting, ask your health care provider. °· Do not use alcohol. °· Avoid caffeine. °· Avoid stress as much as possible. Rest and get plenty of sleep. °General instructions °· Take over-the-counter and prescription medicines only as told by your health care provider. °· While lying down, lie on your left side. This keeps pressure off your baby. °· While sitting or lying down, raise (elevate) your feet. Try putting some pillows under your lower legs. °· Exercise regularly. Ask your health care provider what kinds of exercise are best for you. °· Keep all prenatal and follow-up visits as told by your health care provider. This is important. °Contact a health care provider if: °· You have symptoms that your health care  provider told you may require more treatment or monitoring, such as: °? Fever. °? Vomiting. °? Headache. °Get help right away if: °· You have severe abdominal pain or vomiting that does not get better with treatment. °· You suddenly develop swelling in your hands, ankles, or face. °· You gain 4 lbs (1.8 kg) or more in 1 week. °· You develop vaginal bleeding, or you have blood in your urine. °· You do not feel your baby moving as much as usual. °· You have blurred or double vision. °· You have muscle twitching or sudden tightening (spasms). °· You have shortness of breath. °· Your lips or fingernails turn blue. °This information is not intended to replace advice given to you by your health care provider. Make sure you discuss any questions you have with your health care provider. °Document Released: 08/12/2011 Document Revised: 06/13/2016 Document Reviewed: 05/09/2016 °Elsevier Interactive Patient Education © 2018 [employer] ° °

## 2017-06-19 LAB — GC/CHLAMYDIA PROBE AMP (~~LOC~~) NOT AT ARMC
Chlamydia: NEGATIVE
NEISSERIA GONORRHEA: NEGATIVE

## 2017-06-20 LAB — CULTURE, BETA STREP (GROUP B ONLY)

## 2017-06-20 LAB — OB RESULTS CONSOLE GBS: STREP GROUP B AG: NEGATIVE

## 2017-06-22 ENCOUNTER — Encounter (HOSPITAL_COMMUNITY): Payer: Self-pay

## 2017-06-22 ENCOUNTER — Emergency Department (HOSPITAL_COMMUNITY)
Admission: EM | Admit: 2017-06-22 | Discharge: 2017-06-22 | Disposition: A | Discharge status: Home / Self Care | Payer: Medicaid Other | Attending: Physician Assistant | Admitting: Physician Assistant

## 2017-06-22 DIAGNOSIS — Y929 Unspecified place or not applicable: Secondary | ICD-10-CM | POA: Insufficient documentation

## 2017-06-22 DIAGNOSIS — Y998 Other external cause status: Secondary | ICD-10-CM | POA: Insufficient documentation

## 2017-06-22 DIAGNOSIS — Y939 Activity, unspecified: Secondary | ICD-10-CM | POA: Insufficient documentation

## 2017-06-22 DIAGNOSIS — W109XXA Fall (on) (from) unspecified stairs and steps, initial encounter: Secondary | ICD-10-CM | POA: Insufficient documentation

## 2017-06-22 DIAGNOSIS — S3992XA Unspecified injury of lower back, initial encounter: Secondary | ICD-10-CM | POA: Diagnosis not present

## 2017-06-22 DIAGNOSIS — F17210 Nicotine dependence, cigarettes, uncomplicated: Secondary | ICD-10-CM | POA: Diagnosis not present

## 2017-06-22 DIAGNOSIS — Z7982 Long term (current) use of aspirin: Secondary | ICD-10-CM | POA: Diagnosis not present

## 2017-06-22 DIAGNOSIS — O26899 Other specified pregnancy related conditions, unspecified trimester: Secondary | ICD-10-CM | POA: Diagnosis not present

## 2017-06-22 DIAGNOSIS — F909 Attention-deficit hyperactivity disorder, unspecified type: Secondary | ICD-10-CM | POA: Insufficient documentation

## 2017-06-22 DIAGNOSIS — Y92009 Unspecified place in unspecified non-institutional (private) residence as the place of occurrence of the external cause: Secondary | ICD-10-CM

## 2017-06-22 DIAGNOSIS — W19XXXA Unspecified fall, initial encounter: Secondary | ICD-10-CM

## 2017-06-22 LAB — BASIC METABOLIC PANEL
Anion gap: 8 (ref 5–15)
BUN: <5 mg/dL — ABNORMAL LOW (ref 6–20)
CO2: 21 mmol/L — AB (ref 22–32)
Calcium: 8.3 mg/dL — ABNORMAL LOW (ref 8.9–10.3)
Chloride: 105 mmol/L (ref 101–111)
Creatinine, Ser: 0.68 mg/dL (ref 0.44–1.00)
GFR calc Af Amer: >60 mL/min (ref >60)
GFR calc non Af Amer: >60 mL/min (ref >60)
GLUCOSE: 73 mg/dL (ref 65–99)
POTASSIUM: 3 mmol/L — AB (ref 3.5–5.1)
Sodium: 134 mmol/L — ABNORMAL LOW (ref 135–145)

## 2017-06-22 LAB — URINALYSIS, ROUTINE W REFLEX MICROSCOPIC
Bilirubin Urine: NEGATIVE
Glucose, UA: NEGATIVE mg/dL
HGB URINE DIPSTICK: NEGATIVE
KETONES UR: 5 mg/dL — AB
Nitrite: NEGATIVE
PROTEIN: NEGATIVE mg/dL
Specific Gravity, Urine: 1.003 — ABNORMAL LOW (ref 1.005–1.030)
pH: 6 (ref 5.0–8.0)

## 2017-06-22 LAB — RAPID URINE DRUG SCREEN, HOSP PERFORMED
AMPHETAMINES: NOT DETECTED
BARBITURATES: NOT DETECTED
Benzodiazepines: NOT DETECTED
Cocaine: NOT DETECTED
Opiates: NOT DETECTED
TETRAHYDROCANNABINOL: POSITIVE — AB

## 2017-06-22 LAB — CBC WITH DIFFERENTIAL/PLATELET
BASOS ABS: 0 10*3/uL (ref 0.0–0.1)
Basophils Relative: 0 %
Eosinophils Absolute: 0 10*3/uL (ref 0.0–0.7)
Eosinophils Relative: 0 %
HEMATOCRIT: 34.4 % — AB (ref 36.0–46.0)
HEMOGLOBIN: 11.7 g/dL — AB (ref 12.0–15.0)
LYMPHS PCT: 19 %
Lymphs Abs: 2.1 10*3/uL (ref 0.7–4.0)
MCH: 30.9 pg (ref 26.0–34.0)
MCHC: 34 g/dL (ref 30.0–36.0)
MCV: 90.8 fL (ref 78.0–100.0)
MONO ABS: 0.6 10*3/uL (ref 0.1–1.0)
MONOS PCT: 5 %
NEUTROS ABS: 8.5 10*3/uL — AB (ref 1.7–7.7)
Neutrophils Relative %: 76 %
Platelets: 153 10*3/uL (ref 150–400)
RBC: 3.79 MIL/uL — ABNORMAL LOW (ref 3.87–5.11)
RDW: 14.6 % (ref 11.5–15.5)
WBC: 11.2 10*3/uL — ABNORMAL HIGH (ref 4.0–10.5)

## 2017-06-22 LAB — PROTEIN / CREATININE RATIO, URINE
CREATININE, URINE: 97.04 mg/dL
Protein Creatinine Ratio: 0.2 mg/mg{Cre} — ABNORMAL HIGH (ref 0.00–0.15)
Total Protein, Urine: 19 mg/dL

## 2017-06-22 NOTE — Progress Notes (Signed)
Upon taking patient off of monitor BPs noted to be elevated around 140-150s/80-90s consistently with occasional BPs 130s/80s;  Protein-creatinine ratio ordered and elevated at 0.2. Dr Debroah LoopArnold called back and notified of patient's BPs and PCR and orders given to clear patient OB but have patient follow up with OB in AM; ED nurse made aware of OB clearance
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
## 2017-06-22 NOTE — Progress Notes (Signed)
Dr Debroah LoopArnold called to make aware of patient status and concerns at this time; EFM reactive with occasional contractions noted; patient denies pregnancy complaints at this time; [facility]
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
## 2017-06-22 NOTE — Discharge Instructions (Signed)
Your labs and blood pressure are concerning that you may develop preeclampsia. Please follow up tomorrow morning with your OB/GYN.

## 2017-06-22 NOTE — ED Provider Notes (Signed)
Elevated BP. Ho preecplampsia.  OB called. He think she should be discharged home.  UPC ratio 0.2. OB wanted her to follow up in am.    Abelino DerrickMackuen, Courteney Lyn, MD 06/22/17 2350

## 2017-06-27 ENCOUNTER — Inpatient Hospital Stay (HOSPITAL_COMMUNITY)
Admission: AD | Admit: 2017-06-27 | Discharge: 2017-06-30 | DRG: 765 | Disposition: A | Discharge status: Home / Self Care | Payer: Medicaid Other | Source: Ambulatory Visit | Attending: Obstetrics & Gynecology | Admitting: Obstetrics & Gynecology

## 2017-06-27 ENCOUNTER — Encounter (HOSPITAL_COMMUNITY): Payer: Self-pay | Admitting: *Deleted

## 2017-06-27 DIAGNOSIS — O26893 Other specified pregnancy related conditions, third trimester: Secondary | ICD-10-CM | POA: Diagnosis present

## 2017-06-27 DIAGNOSIS — F17210 Nicotine dependence, cigarettes, uncomplicated: Secondary | ICD-10-CM | POA: Diagnosis present

## 2017-06-27 DIAGNOSIS — B182 Chronic viral hepatitis C: Secondary | ICD-10-CM | POA: Diagnosis present

## 2017-06-27 DIAGNOSIS — O34211 Maternal care for low transverse scar from previous cesarean delivery: Secondary | ICD-10-CM | POA: Diagnosis present

## 2017-06-27 DIAGNOSIS — O99334 Smoking (tobacco) complicating childbirth: Secondary | ICD-10-CM | POA: Diagnosis present

## 2017-06-27 DIAGNOSIS — O99344 Other mental disorders complicating childbirth: Secondary | ICD-10-CM | POA: Diagnosis present

## 2017-06-27 DIAGNOSIS — Z3A38 38 weeks gestation of pregnancy: Secondary | ICD-10-CM

## 2017-06-27 DIAGNOSIS — F1290 Cannabis use, unspecified, uncomplicated: Secondary | ICD-10-CM | POA: Diagnosis present

## 2017-06-27 DIAGNOSIS — Z6791 Unspecified blood type, Rh negative: Secondary | ICD-10-CM

## 2017-06-27 DIAGNOSIS — O9081 Anemia of the puerperium: Secondary | ICD-10-CM | POA: Diagnosis not present

## 2017-06-27 DIAGNOSIS — O1414 Severe pre-eclampsia complicating childbirth: Principal | ICD-10-CM | POA: Diagnosis present

## 2017-06-27 DIAGNOSIS — Z91040 Latex allergy status: Secondary | ICD-10-CM

## 2017-06-27 DIAGNOSIS — O9842 Viral hepatitis complicating childbirth: Secondary | ICD-10-CM | POA: Diagnosis present

## 2017-06-27 DIAGNOSIS — O1413 Severe pre-eclampsia, third trimester: Secondary | ICD-10-CM | POA: Diagnosis present

## 2017-06-27 DIAGNOSIS — F319 Bipolar disorder, unspecified: Secondary | ICD-10-CM | POA: Diagnosis present

## 2017-06-27 DIAGNOSIS — D62 Acute posthemorrhagic anemia: Secondary | ICD-10-CM | POA: Diagnosis not present

## 2017-06-27 DIAGNOSIS — O24420 Gestational diabetes mellitus in childbirth, diet controlled: Secondary | ICD-10-CM | POA: Diagnosis present

## 2017-06-27 HISTORY — DX: Unspecified viral hepatitis C without hepatic coma: B19.20

## 2017-06-27 NOTE — MAU Note (Signed)
Brought in by EMS with ctxs. Ctxs off and on all day. Stronger since 2100. Some pink vag d/c tonight

## 2017-06-28 ENCOUNTER — Encounter (HOSPITAL_COMMUNITY): Payer: Self-pay

## 2017-06-28 ENCOUNTER — Inpatient Hospital Stay (HOSPITAL_COMMUNITY): Payer: Medicaid Other | Admitting: Anesthesiology

## 2017-06-28 ENCOUNTER — Encounter (HOSPITAL_COMMUNITY)
Admission: AD | Disposition: A | Discharge status: Home / Self Care | Payer: Self-pay | Source: Ambulatory Visit | Attending: Obstetrics & Gynecology

## 2017-06-28 DIAGNOSIS — D62 Acute posthemorrhagic anemia: Secondary | ICD-10-CM | POA: Diagnosis not present

## 2017-06-28 DIAGNOSIS — Z91040 Latex allergy status: Secondary | ICD-10-CM | POA: Diagnosis not present

## 2017-06-28 DIAGNOSIS — O24420 Gestational diabetes mellitus in childbirth, diet controlled: Secondary | ICD-10-CM | POA: Diagnosis present

## 2017-06-28 DIAGNOSIS — O34211 Maternal care for low transverse scar from previous cesarean delivery: Secondary | ICD-10-CM | POA: Diagnosis present

## 2017-06-28 DIAGNOSIS — Z6791 Unspecified blood type, Rh negative: Secondary | ICD-10-CM | POA: Diagnosis not present

## 2017-06-28 DIAGNOSIS — F319 Bipolar disorder, unspecified: Secondary | ICD-10-CM | POA: Diagnosis present

## 2017-06-28 DIAGNOSIS — F17210 Nicotine dependence, cigarettes, uncomplicated: Secondary | ICD-10-CM | POA: Diagnosis present

## 2017-06-28 DIAGNOSIS — O9081 Anemia of the puerperium: Secondary | ICD-10-CM | POA: Diagnosis not present

## 2017-06-28 DIAGNOSIS — O99334 Smoking (tobacco) complicating childbirth: Secondary | ICD-10-CM | POA: Diagnosis present

## 2017-06-28 DIAGNOSIS — Z3A38 38 weeks gestation of pregnancy: Secondary | ICD-10-CM

## 2017-06-28 DIAGNOSIS — B182 Chronic viral hepatitis C: Secondary | ICD-10-CM | POA: Diagnosis present

## 2017-06-28 DIAGNOSIS — O26893 Other specified pregnancy related conditions, third trimester: Secondary | ICD-10-CM | POA: Diagnosis present

## 2017-06-28 DIAGNOSIS — F1290 Cannabis use, unspecified, uncomplicated: Secondary | ICD-10-CM | POA: Diagnosis present

## 2017-06-28 DIAGNOSIS — O1413 Severe pre-eclampsia, third trimester: Secondary | ICD-10-CM | POA: Diagnosis present

## 2017-06-28 DIAGNOSIS — O1414 Severe pre-eclampsia complicating childbirth: Secondary | ICD-10-CM | POA: Diagnosis present

## 2017-06-28 DIAGNOSIS — O9842 Viral hepatitis complicating childbirth: Secondary | ICD-10-CM | POA: Diagnosis present

## 2017-06-28 DIAGNOSIS — O99344 Other mental disorders complicating childbirth: Secondary | ICD-10-CM | POA: Diagnosis present

## 2017-06-28 LAB — URINALYSIS, ROUTINE W REFLEX MICROSCOPIC
Bilirubin Urine: NEGATIVE
Glucose, UA: NEGATIVE mg/dL
Ketones, ur: NEGATIVE mg/dL
Nitrite: NEGATIVE
PROTEIN: 100 mg/dL — AB
Specific Gravity, Urine: 1.01 (ref 1.005–1.030)
pH: 7 (ref 5.0–8.0)

## 2017-06-28 LAB — COMPREHENSIVE METABOLIC PANEL
ALBUMIN: 2.8 g/dL — AB (ref 3.5–5.0)
ALT: 26 U/L (ref 14–54)
AST: 36 U/L (ref 15–41)
Alkaline Phosphatase: 164 U/L — ABNORMAL HIGH (ref 38–126)
Anion gap: 7 (ref 5–15)
BILIRUBIN TOTAL: 0.7 mg/dL (ref 0.3–1.2)
BUN: 8 mg/dL (ref 6–20)
CHLORIDE: 106 mmol/L (ref 101–111)
CO2: 23 mmol/L (ref 22–32)
Calcium: 9.2 mg/dL (ref 8.9–10.3)
Creatinine, Ser: 0.62 mg/dL (ref 0.44–1.00)
GFR calc Af Amer: >60 mL/min (ref >60)
GFR calc non Af Amer: >60 mL/min (ref >60)
GLUCOSE: 98 mg/dL (ref 65–99)
POTASSIUM: 3.5 mmol/L (ref 3.5–5.1)
Sodium: 136 mmol/L (ref 135–145)
Total Protein: 6.1 g/dL — ABNORMAL LOW (ref 6.5–8.1)

## 2017-06-28 LAB — PREPARE RBC (CROSSMATCH)

## 2017-06-28 LAB — CBC
HCT: 22.9 % — ABNORMAL LOW (ref 36.0–46.0)
HEMATOCRIT: 36.5 % (ref 36.0–46.0)
HEMOGLOBIN: 7.9 g/dL — AB (ref 12.0–15.0)
Hemoglobin: 12.7 g/dL (ref 12.0–15.0)
MCH: 31.7 pg (ref 26.0–34.0)
MCH: 31.7 pg (ref 26.0–34.0)
MCHC: 34.5 g/dL (ref 30.0–36.0)
MCHC: 34.8 g/dL (ref 30.0–36.0)
MCV: 91 fL (ref 78.0–100.0)
MCV: 92 fL (ref 78.0–100.0)
PLATELETS: 139 10*3/uL — AB (ref 150–400)
PLATELETS: 178 10*3/uL (ref 150–400)
RBC: 2.49 MIL/uL — AB (ref 3.87–5.11)
RBC: 4.01 MIL/uL (ref 3.87–5.11)
RDW: 15 % (ref 11.5–15.5)
RDW: 15.5 % (ref 11.5–15.5)
WBC: 12.7 10*3/uL — AB (ref 4.0–10.5)
WBC: 13.2 10*3/uL — AB (ref 4.0–10.5)

## 2017-06-28 LAB — RAPID URINE DRUG SCREEN, HOSP PERFORMED
AMPHETAMINES: NOT DETECTED
BARBITURATES: NOT DETECTED
Benzodiazepines: NOT DETECTED
Cocaine: NOT DETECTED
OPIATES: NOT DETECTED
TETRAHYDROCANNABINOL: POSITIVE — AB

## 2017-06-28 LAB — ABO/RH: ABO/RH(D): A NEG

## 2017-06-28 LAB — PROTEIN / CREATININE RATIO, URINE
Creatinine, Urine: 82 mg/dL
Protein Creatinine Ratio: 0.7 mg/mg{Cre} — ABNORMAL HIGH (ref 0.00–0.15)
Total Protein, Urine: 57 mg/dL

## 2017-06-28 LAB — GLUCOSE, CAPILLARY
GLUCOSE-CAPILLARY: 88 mg/dL (ref 65–99)
Glucose-Capillary: 90 mg/dL (ref 65–99)

## 2017-06-28 SURGERY — Surgical Case
Anesthesia: Epidural

## 2017-06-28 MED ORDER — DEXAMETHASONE SODIUM PHOSPHATE 4 MG/ML IJ SOLN
INTRAMUSCULAR | Status: DC | PRN
  Administered 2017-06-28: 10 mg via INTRAVENOUS

## 2017-06-28 MED ORDER — SODIUM CHLORIDE 0.9% FLUSH: 3.0000 mL | INTRAVENOUS | Status: DC | PRN

## 2017-06-28 MED ORDER — METRONIDAZOLE 500 MG PO TABS
2000.0000 mg | ORAL_TABLET | Freq: Once | ORAL | Status: AC
  Administered 2017-06-28: 2000 mg via ORAL
  Filled 2017-06-28: qty 4

## 2017-06-28 MED ORDER — PHENYLEPHRINE 40 MCG/ML (10ML) SYRINGE FOR IV PUSH (FOR BLOOD PRESSURE SUPPORT): 80.0000 ug | PREFILLED_SYRINGE | INTRAVENOUS | Status: DC | PRN

## 2017-06-28 MED ORDER — BUPIVACAINE HCL (PF) 0.5 % IJ SOLN
INTRAMUSCULAR | Status: DC | PRN
  Administered 2017-06-28: 30 mL

## 2017-06-28 MED ORDER — BUPIVACAINE HCL (PF) 0.5 % IJ SOLN
INTRAMUSCULAR | Status: AC
Start: 2017-06-28 — End: 2017-06-28
  Filled 2017-06-28: qty 30

## 2017-06-28 MED ORDER — METOCLOPRAMIDE HCL 5 MG/ML IJ SOLN
INTRAMUSCULAR | Status: AC
  Filled 2017-06-28: qty 2

## 2017-06-28 MED ORDER — OXYTOCIN 40 UNITS IN LACTATED RINGERS INFUSION - SIMPLE MED
2.5000 [IU]/h | INTRAVENOUS | Status: DC
  Filled 2017-06-28: qty 1000

## 2017-06-28 MED ORDER — ACETAMINOPHEN 325 MG PO TABS
650.0000 mg | ORAL_TABLET | ORAL | Status: DC | PRN
  Administered 2017-06-29: 650 mg via ORAL
  Filled 2017-06-28: qty 2

## 2017-06-28 MED ORDER — NALBUPHINE SYRINGE 5 MG/0.5 ML
5.0000 mg | INJECTION | INTRAMUSCULAR | Status: DC | PRN
  Filled 2017-06-28: qty 0.5

## 2017-06-28 MED ORDER — HYDRALAZINE HCL 20 MG/ML IJ SOLN: 10.0000 mg | Freq: Once | INTRAMUSCULAR | Status: DC | PRN

## 2017-06-28 MED ORDER — SCOPOLAMINE 1 MG/3DAYS TD PT72
MEDICATED_PATCH | TRANSDERMAL | Status: AC
  Filled 2017-06-28: qty 2

## 2017-06-28 MED ORDER — ZOLPIDEM TARTRATE 5 MG PO TABS: 5.0000 mg | ORAL_TABLET | Freq: Every evening | ORAL | Status: DC | PRN

## 2017-06-28 MED ORDER — METHYLERGONOVINE MALEATE 0.2 MG/ML IJ SOLN
0.2000 mg | INTRAMUSCULAR | Status: DC | PRN
  Administered 2017-06-29: 0.2 mg via INTRAMUSCULAR
  Filled 2017-06-28: qty 1

## 2017-06-28 MED ORDER — DIPHENHYDRAMINE HCL 50 MG/ML IJ SOLN: 25.0000 mg | Freq: Once | INTRAMUSCULAR | Status: DC

## 2017-06-28 MED ORDER — TERBUTALINE SULFATE 1 MG/ML IJ SOLN: 0.2500 mg | Freq: Once | INTRAMUSCULAR | Status: DC | PRN

## 2017-06-28 MED ORDER — MISOPROSTOL 200 MCG PO TABS
800.0000 ug | ORAL_TABLET | Freq: Once | ORAL | Status: AC
  Administered 2017-06-28: 800 ug via RECTAL

## 2017-06-28 MED ORDER — DIPHENHYDRAMINE HCL 25 MG PO CAPS: 25.0000 mg | ORAL_CAPSULE | ORAL | Status: DC | PRN

## 2017-06-28 MED ORDER — OXYTOCIN 10 UNIT/ML IJ SOLN
INTRAMUSCULAR | Status: AC
  Filled 2017-06-28: qty 4

## 2017-06-28 MED ORDER — MISOPROSTOL 200 MCG PO TABS
ORAL_TABLET | ORAL | Status: AC
  Filled 2017-06-28: qty 4

## 2017-06-28 MED ORDER — LABETALOL HCL 5 MG/ML IV SOLN
20.0000 mg | INTRAVENOUS | Status: AC | PRN
  Administered 2017-06-28: 40 mg via INTRAVENOUS
  Administered 2017-06-28: 80 mg via INTRAVENOUS
  Administered 2017-06-28: 20 mg via INTRAVENOUS
  Filled 2017-06-28: qty 16
  Filled 2017-06-28: qty 8

## 2017-06-28 MED ORDER — EPHEDRINE 5 MG/ML INJ: 10.0000 mg | INTRAVENOUS | Status: DC | PRN

## 2017-06-28 MED ORDER — TETANUS-DIPHTH-ACELL PERTUSSIS 5-2.5-18.5 LF-MCG/0.5 IM SUSP: 0.5000 mL | Freq: Once | INTRAMUSCULAR | Status: DC

## 2017-06-28 MED ORDER — COCONUT OIL OIL: 1.0000 "application " | TOPICAL_OIL | Status: DC | PRN

## 2017-06-28 MED ORDER — MENTHOL 3 MG MT LOZG: 1.0000 | LOZENGE | OROMUCOSAL | Status: DC | PRN

## 2017-06-28 MED ORDER — MAGNESIUM SULFATE BOLUS VIA INFUSION
4.0000 g | Freq: Once | INTRAVENOUS | Status: AC
  Administered 2017-06-28: 4 g via INTRAVENOUS
  Filled 2017-06-28: qty 500

## 2017-06-28 MED ORDER — OXYTOCIN BOLUS FROM INFUSION: 500.0000 mL | Freq: Once | INTRAVENOUS | Status: DC

## 2017-06-28 MED ORDER — FENTANYL 2.5 MCG/ML BUPIVACAINE 1/10 % EPIDURAL INFUSION (WH - ANES)
14.0000 mL/h | INTRAMUSCULAR | Status: DC | PRN
  Administered 2017-06-28 (×3): 14 mL/h via EPIDURAL
  Filled 2017-06-28 (×2): qty 100

## 2017-06-28 MED ORDER — KETOROLAC TROMETHAMINE 30 MG/ML IJ SOLN: 30.0000 mg | Freq: Once | INTRAMUSCULAR | Status: DC

## 2017-06-28 MED ORDER — LIDOCAINE HCL (PF) 1 % IJ SOLN
INTRAMUSCULAR | Status: DC | PRN
  Administered 2017-06-28 (×2): 5 mL via EPIDURAL

## 2017-06-28 MED ORDER — NALOXONE HCL 0.4 MG/ML IJ SOLN: 0.4000 mg | INTRAMUSCULAR | Status: DC | PRN

## 2017-06-28 MED ORDER — MEASLES, MUMPS & RUBELLA VAC ~~LOC~~ INJ: 0.5000 mL | INJECTION | Freq: Once | SUBCUTANEOUS | Status: DC

## 2017-06-28 MED ORDER — ACETAMINOPHEN 325 MG PO TABS: 650.0000 mg | ORAL_TABLET | ORAL | Status: DC | PRN

## 2017-06-28 MED ORDER — SOD CITRATE-CITRIC ACID 500-334 MG/5ML PO SOLN
30.0000 mL | ORAL | Status: DC | PRN
  Administered 2017-06-28: 30 mL via ORAL
  Filled 2017-06-28: qty 15

## 2017-06-28 MED ORDER — LACTATED RINGERS IV SOLN: 500.0000 mL | Freq: Once | INTRAVENOUS | Status: DC

## 2017-06-28 MED ORDER — ACETAMINOPHEN 325 MG PO TABS
650.0000 mg | ORAL_TABLET | Freq: Once | ORAL | Status: AC
  Administered 2017-06-28: 650 mg via ORAL
  Filled 2017-06-28: qty 2

## 2017-06-28 MED ORDER — DEXTROSE 5 % IV SOLN
500.0000 mg | Freq: Once | INTRAVENOUS | Status: AC
  Administered 2017-06-28: 500 mg via INTRAVENOUS
  Filled 2017-06-28: qty 500

## 2017-06-28 MED ORDER — LABETALOL HCL 5 MG/ML IV SOLN
INTRAVENOUS | Status: AC
  Administered 2017-06-28: 20 mg via INTRAVENOUS
  Filled 2017-06-28: qty 4

## 2017-06-28 MED ORDER — SIMETHICONE 80 MG PO CHEW
80.0000 mg | CHEWABLE_TABLET | ORAL | Status: DC
  Administered 2017-06-30: 80 mg via ORAL
  Filled 2017-06-28: qty 1

## 2017-06-28 MED ORDER — OXYTOCIN 40 UNITS IN LACTATED RINGERS INFUSION - SIMPLE MED
1.0000 m[IU]/min | INTRAVENOUS | Status: DC
  Administered 2017-06-28: 2 m[IU]/min via INTRAVENOUS

## 2017-06-28 MED ORDER — LACTATED RINGERS IV SOLN: INTRAVENOUS | Status: DC

## 2017-06-28 MED ORDER — FENTANYL 2.5 MCG/ML BUPIVACAINE 1/10 % EPIDURAL INFUSION (WH - ANES)
INTRAMUSCULAR | Status: AC
Start: 2017-06-28 — End: 2017-06-28
  Filled 2017-06-28: qty 100

## 2017-06-28 MED ORDER — KETOROLAC TROMETHAMINE 30 MG/ML IJ SOLN
INTRAMUSCULAR | Status: AC
  Administered 2017-06-28: 30 mg via INTRAMUSCULAR
  Filled 2017-06-28: qty 1

## 2017-06-28 MED ORDER — LIDOCAINE HCL (PF) 1 % IJ SOLN: 30.0000 mL | INTRAMUSCULAR | Status: DC | PRN

## 2017-06-28 MED ORDER — MEPERIDINE HCL 25 MG/ML IJ SOLN: 6.2500 mg | INTRAMUSCULAR | Status: DC | PRN

## 2017-06-28 MED ORDER — METOCLOPRAMIDE HCL 5 MG/ML IJ SOLN
INTRAMUSCULAR | Status: DC | PRN
  Administered 2017-06-28: 10 mg via INTRAVENOUS

## 2017-06-28 MED ORDER — SIMETHICONE 80 MG PO CHEW
80.0000 mg | CHEWABLE_TABLET | Freq: Three times a day (TID) | ORAL | Status: DC
  Administered 2017-06-29 – 2017-06-30 (×3): 80 mg via ORAL
  Filled 2017-06-28 (×3): qty 1

## 2017-06-28 MED ORDER — SCOPOLAMINE 1 MG/3DAYS TD PT72
MEDICATED_PATCH | TRANSDERMAL | Status: DC | PRN
  Administered 2017-06-28: 1 via TRANSDERMAL

## 2017-06-28 MED ORDER — OXYCODONE-ACETAMINOPHEN 5-325 MG PO TABS: 1.0000 | ORAL_TABLET | ORAL | Status: DC | PRN

## 2017-06-28 MED ORDER — LACTATED RINGERS IV SOLN
INTRAVENOUS | Status: DC | PRN
  Administered 2017-06-28: 16:00:00 via INTRAVENOUS

## 2017-06-28 MED ORDER — LABETALOL HCL 5 MG/ML IV SOLN
20.0000 mg | INTRAVENOUS | Status: DC | PRN
Start: 2017-06-28 — End: 2017-06-28

## 2017-06-28 MED ORDER — NALOXONE HCL 2 MG/2ML IJ SOSY: 1.0000 ug/kg/h | PREFILLED_SYRINGE | INTRAMUSCULAR | Status: DC | PRN

## 2017-06-28 MED ORDER — SODIUM BICARBONATE 8.4 % IV SOLN
INTRAVENOUS | Status: DC | PRN
  Administered 2017-06-28: 10 mL via EPIDURAL
  Administered 2017-06-28: 3 mL via EPIDURAL

## 2017-06-28 MED ORDER — PHENYLEPHRINE 40 MCG/ML (10ML) SYRINGE FOR IV PUSH (FOR BLOOD PRESSURE SUPPORT)
PREFILLED_SYRINGE | INTRAVENOUS | Status: AC
  Filled 2017-06-28: qty 10

## 2017-06-28 MED ORDER — MAGNESIUM SULFATE 40 G IN LACTATED RINGERS - SIMPLE
2.0000 g/h | INTRAVENOUS | Status: DC
  Filled 2017-06-28 (×2): qty 500

## 2017-06-28 MED ORDER — IBUPROFEN 600 MG PO TABS
600.0000 mg | ORAL_TABLET | Freq: Four times a day (QID) | ORAL | Status: DC
  Administered 2017-06-29 – 2017-06-30 (×5): 600 mg via ORAL
  Filled 2017-06-28 (×5): qty 1

## 2017-06-28 MED ORDER — LACTATED RINGERS IV SOLN
INTRAVENOUS | Status: DC
  Administered 2017-06-28 (×3): via INTRAVENOUS

## 2017-06-28 MED ORDER — SENNOSIDES-DOCUSATE SODIUM 8.6-50 MG PO TABS
2.0000 | ORAL_TABLET | ORAL | Status: DC
  Administered 2017-06-29: 2 via ORAL
  Filled 2017-06-28: qty 2

## 2017-06-28 MED ORDER — METHYLERGONOVINE MALEATE 0.2 MG/ML IJ SOLN
INTRAMUSCULAR | Status: AC
  Administered 2017-06-28: 0.2 mg
  Filled 2017-06-28: qty 1

## 2017-06-28 MED ORDER — DIPHENHYDRAMINE HCL 50 MG/ML IJ SOLN: 12.5000 mg | INTRAMUSCULAR | Status: DC | PRN

## 2017-06-28 MED ORDER — HYDRALAZINE HCL 20 MG/ML IJ SOLN
10.0000 mg | Freq: Once | INTRAMUSCULAR | Status: AC | PRN
  Administered 2017-06-28: 10 mg via INTRAVENOUS
  Filled 2017-06-28: qty 1

## 2017-06-28 MED ORDER — SODIUM CHLORIDE 0.9 % IV SOLN: Freq: Once | INTRAVENOUS | Status: DC

## 2017-06-28 MED ORDER — SODIUM CHLORIDE 0.9 % IR SOLN
Status: DC | PRN
  Administered 2017-06-28: 1000 mL

## 2017-06-28 MED ORDER — NALBUPHINE SYRINGE 5 MG/0.5 ML
5.0000 mg | INJECTION | Freq: Once | INTRAMUSCULAR | Status: DC | PRN
  Filled 2017-06-28: qty 0.5

## 2017-06-28 MED ORDER — OXYCODONE-ACETAMINOPHEN 5-325 MG PO TABS: 2.0000 | ORAL_TABLET | ORAL | Status: DC | PRN

## 2017-06-28 MED ORDER — DIBUCAINE 1 % RE OINT: 1.0000 "application " | TOPICAL_OINTMENT | RECTAL | Status: DC | PRN

## 2017-06-28 MED ORDER — OXYTOCIN 10 UNIT/ML IJ SOLN
INTRAVENOUS | Status: DC | PRN
  Administered 2017-06-28: 40 [IU] via INTRAVENOUS

## 2017-06-28 MED ORDER — SIMETHICONE 80 MG PO CHEW: 80.0000 mg | CHEWABLE_TABLET | ORAL | Status: DC | PRN

## 2017-06-28 MED ORDER — DEXAMETHASONE SODIUM PHOSPHATE 10 MG/ML IJ SOLN
INTRAMUSCULAR | Status: AC
  Filled 2017-06-28: qty 1

## 2017-06-28 MED ORDER — WITCH HAZEL-GLYCERIN EX PADS: 1.0000 "application " | MEDICATED_PAD | CUTANEOUS | Status: DC | PRN

## 2017-06-28 MED ORDER — OXYTOCIN 40 UNITS IN LACTATED RINGERS INFUSION - SIMPLE MED
2.5000 [IU]/h | INTRAVENOUS | Status: AC
  Administered 2017-06-28: 2.5 [IU]/h via INTRAVENOUS
  Filled 2017-06-28: qty 1000

## 2017-06-28 MED ORDER — ONDANSETRON HCL 4 MG/2ML IJ SOLN: 4.0000 mg | Freq: Three times a day (TID) | INTRAMUSCULAR | Status: DC | PRN

## 2017-06-28 MED ORDER — SCOPOLAMINE 1 MG/3DAYS TD PT72: 1.0000 | MEDICATED_PATCH | Freq: Once | TRANSDERMAL | Status: DC

## 2017-06-28 MED ORDER — LACTATED RINGERS IV SOLN: 500.0000 mL | INTRAVENOUS | Status: DC | PRN

## 2017-06-28 MED ORDER — PRENATAL MULTIVITAMIN CH
1.0000 | ORAL_TABLET | Freq: Every day | ORAL | Status: DC
  Administered 2017-06-29 – 2017-06-30 (×2): 1 via ORAL
  Filled 2017-06-28 (×2): qty 1

## 2017-06-28 MED ORDER — DIPHENHYDRAMINE HCL 25 MG PO CAPS: 25.0000 mg | ORAL_CAPSULE | Freq: Four times a day (QID) | ORAL | Status: DC | PRN

## 2017-06-28 MED ORDER — ONDANSETRON HCL 4 MG/2ML IJ SOLN
4.0000 mg | Freq: Four times a day (QID) | INTRAMUSCULAR | Status: DC | PRN
  Filled 2017-06-28: qty 2

## 2017-06-28 SURGICAL SUPPLY — 42 items
BENZOIN TINCTURE PRP APPL 2/3 (GAUZE/BANDAGES/DRESSINGS) ×3 IMPLANT
BLADE TIP J-PLASMA PRECISE LAP (MISCELLANEOUS) ×3 IMPLANT
CHLORAPREP W/TINT 26ML (MISCELLANEOUS) ×3 IMPLANT
CLAMP CORD UMBIL (MISCELLANEOUS) IMPLANT
CLOSURE STERI-STRIP 1/4X4 (GAUZE/BANDAGES/DRESSINGS) ×3 IMPLANT
CLOSURE WOUND 1/2 X4 (GAUZE/BANDAGES/DRESSINGS) ×1
CLOTH BEACON ORANGE TIMEOUT ST (SAFETY) ×3 IMPLANT
DRSG OPSITE POSTOP 4X10 (GAUZE/BANDAGES/DRESSINGS) ×3 IMPLANT
ELECT REM PT RETURN 9FT ADLT (ELECTROSURGICAL) ×3
ELECTRODE REM PT RTRN 9FT ADLT (ELECTROSURGICAL) ×1 IMPLANT
EXTRACTOR VACUUM KIWI (MISCELLANEOUS) IMPLANT
GAUZE SPONGE 4X4 12PLY STRL LF (GAUZE/BANDAGES/DRESSINGS) ×6 IMPLANT
GLOVE BIO SURGEON STRL SZ7 (GLOVE) ×3 IMPLANT
GLOVE BIOGEL PI IND STRL 7.0 (GLOVE) ×2 IMPLANT
GLOVE BIOGEL PI INDICATOR 7.0 (GLOVE) ×4
GOWN STRL REUS W/TWL LRG LVL3 (GOWN DISPOSABLE) ×6 IMPLANT
GOWN STRL REUS W/TWL XL LVL3 (GOWN DISPOSABLE) ×3 IMPLANT
IV SET ADMIN PUMP GEMINI W/NLD (IV SETS) ×3 IMPLANT
KIT ABG SYR 3ML LUER SLIP (SYRINGE) IMPLANT
NEEDLE HYPO 22GX1.5 SAFETY (NEEDLE) ×3 IMPLANT
NEEDLE HYPO 25X5/8 SAFETYGLIDE (NEEDLE) IMPLANT
NS IRRIG 1000ML POUR BTL (IV SOLUTION) ×3 IMPLANT
PACK C SECTION WH (CUSTOM PROCEDURE TRAY) ×3 IMPLANT
PAD ABD 7.5X8 STRL (GAUZE/BANDAGES/DRESSINGS) ×3 IMPLANT
PAD ABD 8X7 1/2 STERILE (GAUZE/BANDAGES/DRESSINGS) ×3 IMPLANT
PAD OB MATERNITY 4.3X12.25 (PERSONAL CARE ITEMS) ×3 IMPLANT
PENCIL SMOKE EVAC W/HOLSTER (ELECTROSURGICAL) ×3 IMPLANT
RTRCTR C-SECT PINK 25CM LRG (MISCELLANEOUS) IMPLANT
SPONGE GAUZE 4X4 12PLY (GAUZE/BANDAGES/DRESSINGS) ×6 IMPLANT
SPONGE SURGIFOAM ABS GEL 12-7 (HEMOSTASIS) IMPLANT
STRIP CLOSURE SKIN 1/2X4 (GAUZE/BANDAGES/DRESSINGS) ×2 IMPLANT
SUT PDS AB 0 CTX 60 (SUTURE) IMPLANT
SUT PLAIN 0 NONE (SUTURE) IMPLANT
SUT SILK 0 TIES 10X30 (SUTURE) IMPLANT
SUT VIC AB 0 CT1 36 (SUTURE) ×9 IMPLANT
SUT VIC AB 3-0 CT1 27 (SUTURE) ×2
SUT VIC AB 3-0 CT1 TAPERPNT 27 (SUTURE) ×1 IMPLANT
SUT VIC AB 4-0 KS 27 (SUTURE) IMPLANT
SYR 50ML LL SCALE MARK (SYRINGE) ×3 IMPLANT
SYR CONTROL 10ML LL (SYRINGE) ×3 IMPLANT
TOWEL OR 17X24 6PK STRL BLUE (TOWEL DISPOSABLE) ×3 IMPLANT
TRAY FOLEY BAG SILVER LF 14FR (SET/KITS/TRAYS/PACK) ×3 IMPLANT

## 2017-06-28 NOTE — Anesthesia Postprocedure Evaluation (Signed)
Anesthesia Post Note  Patient: Jasmine FudgeAutumn Nicole Mahurin  Procedure(s) Performed: Procedure(s) (LRB): CESAREAN SECTION (N/A)     Patient location during evaluation: Mother Baby Anesthesia Type: Epidural Level of consciousness: awake and alert Pain management: pain level controlled Vital Signs Assessment: post-procedure vital signs reviewed and stable Respiratory status: spontaneous breathing, nonlabored ventilation and respiratory function stable Cardiovascular status: stable Postop Assessment: no headache, no backache and epidural receding Anesthetic complications: no    Last Vitals:  Vitals:   06/28/17 1807 06/28/17 1827  BP:  (!) 84/39  Pulse: 86 77  Resp: 17 18  Temp:  36.4 C    Last Pain:  Vitals:   06/28/17 1827  TempSrc: Oral  PainSc:    Pain Goal: Patients Stated Pain Goal: 0 (06/27/17 2342)               Chloris Marcoux

## 2017-06-28 NOTE — Progress Notes (Signed)
Jasmine Taylor is a 21 y.o. [phone] at 6528w1d by ultrasound admitted for early labor  Subjective:   Objective: BP (!) 134/96   Pulse 71   Temp 97.7 F (36.5 C) (Oral)   Resp 18   Ht 4\' 11"  (1.499 m)   Wt 156 lb (70.8 kg)   LMP 09/08/2016 (Exact Date)   SpO2 96%   BMI 31.51 kg/m  I/O last 3 completed shifts: In: 1169.9 [P.O.:380; I.V.:789.9] Out: 1175 [Urine:1175] Total I/O In: 712 [P.O.:462; I.V.:250] Out: 275 [Urine:275]  FHT:  FHR: 115-120 bpm, variability: moderate,  accelerations:  Present,  decelerations:  Absent UC:   regular, every 4 minutes SVE:   Dilation: 6 Effacement (%): 80 Station: -2 Exam by:: darlene Tramel Westbrook cnm  Labs: Lab Results  Component Value Date   WBC 12.7 (H) 06/27/2017   HGB 12.7 06/27/2017   HCT 36.5 06/27/2017   MCV 91.0 06/27/2017   PLT 178 06/27/2017    Assessment / Plan: Augmentation of labor, progressing well. SVE 6/80/-2 AROM cl fluid, IUPC placed.  Labor: Progressing normally Preeclampsia:  no signs or symptoms of toxicity Fetal Wellbeing:  Category I Pain Control:  Epidural I/D:  n/a Anticipated MOD:  NSVD  Jasmine Taylor 06/28/2017, 9:41 AM

## 2017-06-28 NOTE — Progress Notes (Signed)
15:30 called to pts room for FHR decels. SVE complete/0 station, deep variable decels to 70-80's. Position change, O2 @ 10L, pit off. IV LR to bolus. 15:35 Dr. Dolan AmenHarroway-Smith to bedside for eval of pt. 15:50 To OR for Stat C/S for prolong deep variable decels.

## 2017-06-28 NOTE — Transfer of Care (Signed)
Immediate Anesthesia Transfer of Care Note  Patient: Jasmine Taylor  Procedure(s) Performed: Procedure(s): CESAREAN SECTION (N/A)  Patient Location: PACU  Anesthesia Type:Epidural  Level of Consciousness: awake, alert  and oriented  Airway & Oxygen Therapy: Patient Spontanous Breathing  Post-op Assessment: Report given to RN and Post -op Vital signs reviewed and stable  Post vital signs: Reviewed and stable  Last Vitals:  Vitals:   06/28/17 1332 06/28/17 1402  BP: (!) 156/98 (!) 152/95  Pulse: 82 72  Resp:  20  Temp:  36.4 C    Last Pain:  Vitals:   06/28/17 1402  TempSrc: Oral  PainSc:       Patients Stated Pain Goal: 0 (06/27/17 2342)  Complications: No apparent anesthesia complications

## 2017-06-28 NOTE — Progress Notes (Signed)
Patient ID: Jasmine FudgeAutumn Nicole Taylor, female   DOB: 1996-01-05, 21 y.o.   MRN: [id number] This is a late entry. I was called to see pt for fetal distress. The pt was almost fully dilated with an ant lip. An attempt was made to have pt push past the cervix however, the infant was noted to be in the Op position and did not make any progress with pushing. afer discussion with pt a decision was made to progress to an operative delivery.  The risks of cesarean section discussed with the patient included but were not limited to: bleeding which may require transfusion or reoperation; infection which may require antibiotics; injury to bowel, bladder, ureters or other surrounding organs; injury to the fetus; need for additional procedures including hysterectomy in the event of a life-threatening hemorrhage; placental abnormalities wth subsequent pregnancies, incisional problems, thromboembolic phenomenon and other postoperative/anesthesia complications. The patient concurred with the proposed plan, giving informed written consent for the procedure.   Patient has been NPO since yes this am she will remain NPO for procedure. Anesthesia and OR aware. Preoperative prophylactic antibiotics and SCDs ordered on call to the OR.  To OR when ready.  Maleena Eddleman L. Harraway-Smith, M.D., Evern CoreFACOG

## 2017-06-28 NOTE — Progress Notes (Signed)
G4P2 s/p RCS on magnesium for Pre-e.  By has current trichinous and Hep C infection.  THC+ on admission.  Pt is sleepy but easily aroused, has no concerns or complaints at this time.

## 2017-06-28 NOTE — Op Note (Signed)
06/27/2017 - 06/28/2017  6:20 PM  PATIENT:  Jasmine Taylor  21 y.o. female  PRE-OPERATIVE DIAGNOSIS:  c/s for fetal distress  POST-OPERATIVE DIAGNOSIS:  c/s for fetal distress  PROCEDURE:  Procedure(s): CESAREAN SECTION (N/A)  SURGEON:  Surgeon(s) and Role:    * Willodean Rosenthal, MD - Primary  ANESTHESIA:   epidural  EBL:  Total I/O In: 4446 [P.O.:1146; I.V.:3300] Out: 3675 [Urine:2050; Emesis/NG output:675; Blood:950]  BLOOD ADMINISTERED:none  DRAINS: none   LOCAL MEDICATIONS USED:  MARCAINE     SPECIMEN:  Source of Specimen:  placenta  DISPOSITION OF SPECIMEN:  PATHOLOGY  COUNTS:  YES  TOURNIQUET:  * No tourniquets in log *  DICTATION: .Note written in EPIC  PLAN OF CARE: Admit to inpatient   PATIENT DISPOSITION:  PACU - hemodynamically stable.   Delay start of Pharmacological VTE agent (>24hrs) due to surgical blood loss or risk of bleeding: yes  Complications : none immediate  INDICATIONS: Jasmine Taylor is a 21 y.o. [id number] at [redacted]w[redacted]d here for cesarean section secondary to the indications listed under preoperative diagnosis; please see preoperative note for further details.  The risks of cesarean section were discussed with the patient including but were not limited to: bleeding which may require transfusion or reoperation; infection which may require antibiotics; injury to bowel, bladder, ureters or other surrounding organs; injury to the fetus; need for additional procedures including hysterectomy in the event of a life-threatening hemorrhage; placental abnormalities wth subsequent pregnancies, incisional problems, thromboembolic phenomenon and other postoperative/anesthesia complications.   The patient concurred with the proposed plan, giving informed written consent for the procedure.    FINDINGS:  Viable female infant in cephalic presentation.  Apgars pending.  Clear amniotic fluid.  But. Terminal mec was noted.  intact placenta, three vessel  cord.  Normal uterus, fallopian tubes and ovaries bilaterally.  The was a nuchal cord and a body cord x 1  PROCEDURE IN DETAIL:  The patient preoperatively received intravenous antibiotics and had sequential compression devices applied to her lower extremities.  She was then taken to the operating room where spinal anesthesia was administered and was found to be adequate. She was then placed in a dorsal supine position with a leftward tilt, and prepped and draped in a sterile manner.  A foley catheter was placed into her bladder and attached to constant gravity.  After an adequate timeout was performed, a Pfannenstiel skin incision was made with scalpel and carried through to the underlying layer of fascia. The fascia was incised in the midline, and this incision was extended bilaterally using the Mayo scissors.  Kocher clamps were applied to the superior aspect of the fascial incision and the underlying rectus muscles were dissected off bluntly. A similar process was carried out on the inferior aspect of the fascial incision. The rectus muscles were separated in the midline bluntly and the peritoneum was entered bluntly.  A bladder flap was created due to the adhesions of bladder to the uterus. Attention was turned to the lower uterine segment where a low transverse hysterotomy incision was made with a scalpel and extended bilaterally bluntly.  The infant was successfully delivered, the cord was clamped and cut and the infant was handed over to awaiting neonatology team. The placenta was delivered manually. Uterine massage was then administered.  The placenta was intact with a three-vessel cord. The uterus was exteriorized and was then cleared of clot and debris.  The hysterotomy was closed with 0 Vicryl in a running locked  fashion, and an imbricating layer was also placed with the same suture. The uterus was returned to the pelvis. The pelvis was cleared of all clot and debris. Hemostasis was confirmed on all  surfaces.  The peritoneum and the muscles were reapproximated using 0 Vicryl with 1 interrupted suture. The fascia was then closed using 0 Vicryl.  The subcutaneous layer was irrigated, then reapproximated with 3-0 vicryl in a running locked fashion, and the skin was closed with a 4-0 Vicryl subcuticular stitch.  30 cc of 0.5% marcaine was injected into the incision and benzoin and steristrips were applied.  The patient tolerated the procedure well. Sponge, lap, instrument and needle counts were correct x 2.  She was taken to the recovery room in stable condition.   Dmari Schubring L. Harraway-Smith, M.D., Evern CoreFACOG

## 2017-06-28 NOTE — Progress Notes (Signed)
Subjective: Patient sleeping. Feeling no pain with epidural. Denies headache, visual changes or epigastric pain.  Objective: Vitals:   06/28/17 0302 06/28/17 0400 06/28/17 0500 06/28/17 0600  BP:  123/75 120/64 118/69  Pulse:  81 70 67  Resp: 18 17 17 16   Temp: 98 F (36.7 C)     TempSrc: Oral     SpO2:   95% 94%  Weight:      Height:       Total I/O In: 1044.6 [P.O.:380; I.V.:664.6] Out: 975 [Urine:975]  FHT:  FHR: 115 bpm, variability: moderate,  accelerations:  Present,  decelerations:  Absent UC:   regular, every 6-10 minutes SVE:   Dilation: 5 Effacement (%): 80 Station: -2 Exam by:: Niell, S-CNM  Labs: Lab Results  Component Value Date   WBC 12.7 (H) 06/27/2017   HGB 12.7 06/27/2017   HCT 36.5 06/27/2017   MCV 91.0 06/27/2017   PLT 178 06/27/2017   Assessment / Plan: IUP at term. SOL, preeclampsia with severe features. GBS neg  Discussed with patient risks and benefits of pitocin for labor augmentation due to minimal cervical change since admission. Patient agreeable to start pitocin.   Patient positive for trich in May, never picked up antibiotics. Will treat today.   Plan: pitocin 2x2. Plan to recheck in 1-2 hours  Cleone SlimCaroline Neill [facility] 06/28/2017, 6:39 AM  I confirm that I have verified the information documented in the student midwife's note and that I have also personally performed the physical exam and all medical decision making activities.  Sharen CounterLisa Leftwich-Kirby, CNM 8:48 PM

## 2017-06-28 NOTE — Progress Notes (Signed)
NICU called for delivery

## 2017-06-28 NOTE — MAU Provider Note (Signed)
History     CSN: [id number]  Arrival date and time: 06/27/17 2335   None     Chief Complaint  Patient presents with  . Contractions   HPI   Jasmine Taylor is a 21 y.o. female [phone] @ 70w1dwho arrived via EMS with contractions. Patient has gestational HTN with this pregnancy and was unable to follow up for BP checks in the office.   She is currently monitored for the following issues for this high-risk pregnancy and has Supervision of high risk pregnancy, antepartum; Hx of preeclampsia, prior pregnancy, currently pregnant; Previous cesarean delivery, antepartum; Bipolar disorder ([place]; Rh negative, antepartum; Pregnancy complicated by chronic hepatitis C, antepartum ([place]; Hx of drug abuse; and Gestational diabetes mellitus (GDM) affecting pregnancy, antepartum on her problem list.  She desires TOLAC   OB History    Gravida Para Term Preterm AB Living   4 1 1  0 2 1   SAB TAB Ectopic Multiple Live Births   2 0 0 2 1      Past Medical History:  Diagnosis Date  . ADHD (attention deficit hyperactivity disorder)   . Anemia   . Anxiety   . Depression   . Hepatitis C   . Pregnancy induced hypertension     Past Surgical History:  Procedure Laterality Date  . cesarean    . CESAREAN SECTION      History reviewed. No pertinent family history.  Social History  Substance Use Topics  . Smoking status: Current Every Day Smoker    Packs/day: 0.50    Types: Cigarettes  . Smokeless tobacco: Never Used  . Alcohol use No    Allergies:  Allergies  Allergen Reactions  . Influenza A (H1n1) Monovalent Vaccine Swelling    Prescriptions Prior to Admission  Medication Sig Dispense Refill Last Dose  . acetaminophen (TYLENOL) 500 MG tablet Take 1,000 mg by mouth every 6 (six) hours as needed for headache.   06/26/2017 at Unknown time  . aspirin EC 81 MG tablet Take 1 tablet (81 mg total) by mouth daily. 30 tablet 6 06/27/2017 at Unknown time  . calcium carbonate (TUMS -  DOSED IN MG ELEMENTAL CALCIUM) 500 MG chewable tablet Chew 1 tablet by mouth daily as needed.    06/26/2017 at Unknown time  . pantoprazole (PROTONIX) 20 MG tablet Take 2 tablets (40 mg total) by mouth daily. 30 tablet 6 06/26/2017 at Unknown time  . Prenatal Vit-Fe Phos-FA-Omega (VITAFOL GUMMIES) 3.33-0.333-34.8 MG CHEW Chew 1 tablet by mouth daily. 90 tablet 8 06/27/2017 at Unknown time  . ACCU-CHEK FASTCLIX LANCETS MISC 1 Units by Percutaneous route 4 (four) times daily. (Patient not taking: Reported on 06/22/2017) 100 each 12 Not Taking at Unknown time  . Blood Glucose Monitoring Suppl (ACCU-CHEK NANO SMARTVIEW) w/Device KIT 1 kit by Subdermal route as directed. Check blood sugars for fasting, and two hours after breakfast, lunch and dinner (4 checks daily) (Patient not taking: Reported on 06/22/2017) 1 kit 0 Not Taking at Unknown time  . butalbital-acetaminophen-caffeine (FIORICET, ESGIC) 50-325-40 MG tablet TAKE ONE TABLET BY MOUTH TWICE DAILY AS NEEDED FOR HEADACHE  0 06/22/2017 at Unknown time  . glucose blood (ACCU-CHEK SMARTVIEW) test strip Use as instructed to check blood sugars (Patient not taking: Reported on 06/22/2017) 100 each 12 Not Taking at Unknown time   Results for orders placed or performed during the hospital encounter of 06/27/17 (from the past 48 hour(s))  Urinalysis, Routine w reflex microscopic     Status:  Abnormal   Collection Time: 06/27/17 11:40 PM  Result Value Ref Range   Color, Urine YELLOW YELLOW   APPearance HAZY (A) CLEAR   Specific Gravity, Urine 1.010 1.005 - 1.030   pH 7.0 5.0 - 8.0   Glucose, UA NEGATIVE NEGATIVE mg/dL   Hgb urine dipstick LARGE (A) NEGATIVE   Bilirubin Urine NEGATIVE NEGATIVE   Ketones, ur NEGATIVE NEGATIVE mg/dL   Protein, ur 100 (A) NEGATIVE mg/dL   Nitrite NEGATIVE NEGATIVE   Leukocytes, UA LARGE (A) NEGATIVE   RBC / HPF TOO NUMEROUS TO COUNT 0 - 5 RBC/hpf   WBC, UA TOO NUMEROUS TO COUNT 0 - 5 WBC/hpf   Bacteria, UA RARE (A) NONE SEEN    Squamous Epithelial / LPF 0-5 (A) NONE SEEN   Trans Epithel, UA <1    Mucous PRESENT   CBC     Status: Abnormal   Collection Time: 06/27/17 11:59 PM  Result Value Ref Range   WBC 12.7 (H) 4.0 - 10.5 K/uL   RBC 4.01 3.87 - 5.11 MIL/uL   Hemoglobin 12.7 12.0 - 15.0 g/dL   HCT 36.5 36.0 - 46.0 %   MCV 91.0 78.0 - 100.0 fL   MCH 31.7 26.0 - 34.0 pg   MCHC 34.8 30.0 - 36.0 g/dL   RDW 15.0 11.5 - 15.5 %   Platelets 178 150 - 400 K/uL    Review of Systems  Eyes: Negative for photophobia.  Gastrointestinal: Negative for abdominal pain.  Genitourinary: Negative for vaginal bleeding.  Neurological: Negative for dizziness and headaches.   Physical Exam   Blood pressure (!) 156/108, pulse 76, temperature 98 F (36.7 C), temperature source Oral, resp. rate 16, last menstrual period 09/08/2016, unknown if currently breastfeeding.  Patient Vitals for the past 24 hrs:  BP Temp Temp src Pulse Resp  06/28/17 0001 (!) 156/108 - - 76 -  06/27/17 2355 (!) 174/105 - - 84 -  06/27/17 2343 (!) 160/102 98 F (36.7 C) Oral 78 16    Physical Exam  Constitutional: She is oriented to person, place, and time. She appears well-developed and well-nourished. No distress.  HENT:  Head: Normocephalic.  Respiratory: Effort normal.  Musculoskeletal: Normal range of motion. She exhibits no edema.  Neurological: She is alert and oriented to person, place, and time. She has normal reflexes. She displays normal reflexes.  Negative clonus   Skin: Skin is warm. She is not diaphoretic.  Psychiatric: Her behavior is normal.   Fetal Tracing: Baseline: 120 bpm Variability: moderate  Accelerations: 15x15 Decelerations: none Toco: Q5-7, irregular pattern  MAU Course  Procedures  None   MDM  PIH labs Urine drug screen Discussed BP readings with Dr. Ilda Basset admit to labor and delivery Labetalol protocol initiated   Assessment and Plan   A:  1. Preeclampsia, severe, third trimester      P:  Admit to labor and delivery GBS negative  Urine drug screen pending    Noni Saupe I, NP 06/28/2017 12:22 AM

## 2017-06-28 NOTE — Consult Note (Signed)
Neonatology Note:   Attendance at C-section:    I was asked by Dr. Harraway-Smith to attend this stat C/S at term for fetal distress. The mother is a 20 y.o. female G4P1021, GBS negative with good prenatal care complicated by GHTN now pre-eclampsia, GDM, +tob and THC use, psychiatric issues and Hepatitis C with recent h/o trich. Given IV Magnesium, Azith and Flagyl.  ROM 7 hours before delivery, fluid clear. Infant vigorous with good spontaneous cry and tone. 60 second DCC.  Needed only minimal bulb suctioning. Ap 8/9. Lungs clear to ausc in DR. Terminal meconium. To CN to care of Pediatrician.  Father updated at bedside and mother in OR.   David C. Ehrmann, MD 

## 2017-06-28 NOTE — H&P (Signed)
Jasmine Taylor is a 21 y.o. female [phone] at 38 weeks 1 day presenting for contractions. She reports contractions that started at 2130 and got progressively worse. Denies bleeding or leaking of fluid. Reports good fetal movement. Patient has gestational HTN with this pregnancy and was unable to follow up for BP checks in the office. She denies any headache, visual changes or epigastric pain.  She is currently monitored for the following issues for this high-riskpregnancy and has Supervision of high risk pregnancy, antepartum; Hx of preeclampsia, prior pregnancy, currently pregnant; Previous cesarean delivery, antepartum; Bipolar disorder (HCC); Rh negative, antepartum; Pregnancy complicated by chronic hepatitis C, antepartum (HCC); Hx of drug abuse; and Gestational diabetes mellitus (GDM) affecting pregnancy, antepartum on her problem list.  She desires TOLAC, previous c/s for failure to progress in second stage- states she pushed for over 4 hours.    Clinic [facility] Prenatal Labs  Dating U/S 11.3 on 12/23/16 Blood type: A/Negative/-- (04/13 1138) A neg  Genetic Screen Declined all testing Antibody:Negative (04/13 1138)+c +e (outside lab showed possible cde genotype)  Anatomic US Mild UTD. rescan on 5/25 Rubella: 1.12 (04/13 1138)  GTT A1C:4.4               Third trimester:  RPR: Non Reactive (04/13 1138)   Flu vaccine Declined HBsAg: Negative (04/13 1138)   TDaP vaccine Declined                             Rhogam: HIV: Non Reactive (04/13 1138)   Baby Food Breast                                             GBS: (For PCN allergy, check sensitivities)  Contraception Undecided  AVW:UJWJXBJYPap:underage   Circumcision [facility]: negative  Pediatrician undecided   Support Person FOB: Josh      OB History    Gravida Para Term Preterm AB Living   4 1 1  0 2 1   SAB TAB Ectopic Multiple Live Births   2 0 0 2 1     Past Medical History:  Diagnosis Date  . ADHD (attention deficit hyperactivity  disorder)   . Anemia   . Anxiety   . Depression   . Hepatitis C   . Pregnancy induced hypertension    Past Surgical History:  Procedure Laterality Date  . CESAREAN SECTION     Family History: family history is not on file. Social History:  reports that she has been smoking Cigarettes.  She has been smoking about 0.50 packs per day. She has never used smokeless tobacco. She reports that she does not drink alcohol or use drugs.     Maternal Diabetes: Yes:  Diabetes Type:  Diet controlled Genetic Screening: Normal Maternal Ultrasounds/Referrals: Normal Fetal Ultrasounds or other Referrals:  None Maternal Substance Abuse:  Yes:  Type: Marijuana Significant Maternal Medications:  None Significant Maternal Lab Results:  Lab values include: Group B Strep negative Other Comments:  None  Review of Systems  Constitutional: Negative.  Negative for chills and fever.  Eyes: Negative.  Negative for blurred vision and double vision.  Respiratory: Negative.  Negative for shortness of breath.   Cardiovascular: Negative.  Negative for chest pain.  Gastrointestinal: Positive for abdominal pain.  Neurological: Negative.  Negative for dizziness and headaches.  Maternal Medical History:  Reason for admission: Contractions.   Contractions: Onset was 3-5 hours ago.   Frequency: regular.   Duration is approximately 80 seconds.   Perceived severity is moderate.    Fetal activity: Perceived fetal activity is normal.   Last perceived fetal movement was within the past hour.    Prenatal complications: PIH.   Prenatal Complications - Diabetes: gestational. Diabetes is managed by diet.      Dilation: 4 Effacement (%): 80 Station: -2 Exam by:: V Rogers RN  Blood pressure 137/87, pulse 84, temperature 97.9 F (36.6 C), temperature source Axillary, resp. rate 16, height 4\' 11"  (1.499 m), weight 156 lb (70.8 kg), last menstrual period 09/08/2016, SpO2 100 %, unknown if currently  breastfeeding. Maternal Exam:  Uterine Assessment: Contraction strength is moderate.  Contraction frequency is regular.   Abdomen: Patient reports no abdominal tenderness. Fetal presentation: vertex  Introitus: Normal vulva. Normal vagina.  Ferning test: not done.  Nitrazine test: not done. Amniotic fluid character: not assessed.  Cervix: Cervix evaluated by digital exam.     Fetal Exam Fetal Monitor Review: Mode: ultrasound.   Baseline rate: 120.  Variability: moderate (6-25 bpm).   Pattern: accelerations present and no decelerations.    Fetal State Assessment: Category I - tracings are normal.     Physical Exam  Nursing note and vitals reviewed. Constitutional: She is oriented to person, place, and time. She appears well-developed and well-nourished.  HENT:  Head: Normocephalic and atraumatic.  Eyes: Conjunctivae are normal. No scleral icterus.  Cardiovascular: Normal rate, regular rhythm and normal heart sounds.   Respiratory: Effort normal and breath sounds normal. No respiratory distress.  GI: Soft. She exhibits no distension. There is no tenderness. There is no guarding.  Neurological: She is alert and oriented to person, place, and time. She has normal reflexes. No cranial nerve deficit or sensory deficit.  Skin: Skin is warm and dry.  Psychiatric: She has a normal mood and affect. Her behavior is normal. Judgment and thought content normal.    Prenatal labs: ABO, Rh: --/--/A NEG (07/21 2359) Antibody: NEG (07/21 2359) Rubella: 1.12 (04/13 1138) RPR: Non Reactive (05/21 1213)  HBsAg: Negative (04/13 1138)  HIV: Non-reactive (12/13 0000)  GBS:   Negative  Assessment/Plan: IUP at term. SOL, preeclampsia with severe features. GBS neg.    Admit to birthing suites Mag 4g bolus then 2g/hr Continue labetalol protocol prn Manage labor expectantly Patient plans epidural for pain management MOF: breast MOC: unsure, counseled on options Anticipate NSVD  Cleone Slim SNM 06/28/2017, 2:17 AM   I confirm that I have verified the information documented in the student midwife's note and that I have also personally performed the physical exam and all medical decision making activities.  Sharen Counter, CNM 8:48 PM

## 2017-06-28 NOTE — Progress Notes (Signed)
LABOR PROGRESS NOTE  Jasmine Taylor is a 21 y.o. [phone] at 3171w1d  admitted for SOL  Subjective: Patient comfortable on epidural. Denies HA, vision change, CP, SOB, or RUQ pain.  Objective: BP (!) 134/96   Pulse 71   Temp 97.7 F (36.5 C) (Oral)   Resp 18   Ht 4\' 11"  (1.499 m)   Wt 70.8 kg (156 lb)   LMP 09/08/2016 (Exact Date)   SpO2 96%   BMI 31.51 kg/m  or  Vitals:   06/28/17 0802 06/28/17 0832 06/28/17 0902 06/28/17 0932  BP: (!) 140/92 (!) 148/89 131/80 (!) 134/96  Pulse: 69 69 66 71  Resp:    18  Temp:    97.7 F (36.5 C)  TempSrc:    Oral  SpO2:      Weight:      Height:        FHT: 115/mod/-ac/+variable decelerations with contraction, immediate recovery Toco: q3-4 minutes, regular Dilation: 6 Effacement (%): 80 Cervical Position: Middle Station: -2 Presentation: Vertex Exam by:: darlene lawson cnm  Labs: Lab Results  Component Value Date   WBC 12.7 (H) 06/27/2017   HGB 12.7 06/27/2017   HCT 36.5 06/27/2017   MCV 91.0 06/27/2017   PLT 178 06/27/2017    Patient Active Problem List   Diagnosis Date Noted  . Preeclampsia, severe, third trimester 06/28/2017  . Gestational diabetes mellitus (GDM) affecting pregnancy, antepartum 05/07/2017  . 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 Hx of drug abuse 03/20/2017  . Rh negative, antepartum 02/12/2017  . Supervision of high risk pregnancy, antepartum 01/14/2017  . Hx of preeclampsia, prior pregnancy, currently pregnant 01/14/2017  . Previous cesarean delivery, antepartum 01/14/2017  . Bipolar disorder (HCC) 01/14/2017    Assessment / Plan: 21 y.o. [id number] at 2871w1d here for SOL  Labor: Pit increased to 6 at 0800, started at 3 0700, AROM@0935  Fetal Wellbeing:  Category 2, reactive tracing likely 2/2 mild cord compression with contraction; will reposition and intervene as needed. Pain Control:  Epidural Anticipated MOD:  Vaginal  Conard NovakJoshua A Nneoma Harral,  MD 06/28/2017, 12:04 PM

## 2017-06-28 NOTE — Anesthesia Procedure Notes (Signed)
Epidural Patient location during procedure: OB Start time: 06/28/2017 12:47 AM End time: 06/28/2017 1:05 AM  Staffing Anesthesiologist: Heather RobertsSINGER, Annitta Fifield Performed: anesthesiologist   Preanesthetic Checklist Completed: patient identified, site marked, pre-op evaluation, timeout performed, IV checked, risks and benefits discussed and monitors and equipment checked  Epidural Patient position: sitting Prep: DuraPrep Patient monitoring: heart rate, cardiac monitor, continuous pulse ox and blood pressure Approach: midline Location: L2-L3 Injection technique: LOR saline  Needle:  Needle type: Tuohy  Needle gauge: 17 G Needle length: 9 cm Needle insertion depth: 6 cm Catheter size: 20 Guage Catheter at skin depth: 11 cm Test dose: negative and Other  Assessment Events: blood not aspirated, injection not painful, no injection resistance and negative IV test  Additional Notes Informed consent obtained prior to proceeding including risk of failure, 1% risk of PDPH, risk of minor discomfort and bruising.  Discussed rare but serious complications including epidural abscess, permanent nerve injury, epidural hematoma.  Discussed alternatives to epidural analgesia and patient desires to proceed.  Timeout performed pre-procedure verifying patient name, procedure, and platelet count.  Patient tolerated procedure well.

## 2017-06-28 NOTE — Progress Notes (Signed)
Patient with Latex allergy and patient claims it makes her itch.  Latex foley catheter in place, patient claims it does not bother her.   Dr. [name] FullingHarraway-Smith informed and was advised to leave foley in place as patient denies any discomfort.  Will continue to observe.

## 2017-06-28 NOTE — Anesthesia Pain Management Evaluation Note (Signed)
  CRNA Pain Management Visit Note  Patient: Jasmine FudgeAutumn Nicole Taylor, 21 y.o., female  "Hello I am a member of the anesthesia team at [facility]. We have an anesthesia team available at all times to provide care throughout the hospital, including epidural management and anesthesia for C-section. I don't know your plan for the delivery whether it a natural birth, water birth, IV sedation, nitrous supplementation, doula or epidural, but we want to meet your pain goals."   1.Was your pain managed to your expectations on prior hospitalizations?   No prior hospitalizations  2.What is your expectation for pain management during this hospitalization?     Epidural  3.How can we help you reach that goal? Epidural in place at time of visit   Record the patient's initial score and the patient's pain goal.   Pain: 5  Pain Goal: 7 [facility] wants you to be able to say your pain was always managed very well.  Rica RecordsICKELTON,Jasmine Taylor 06/28/2017

## 2017-06-28 NOTE — Brief Op Note (Signed)
06/27/2017 - 06/28/2017  6:20 PM  PATIENT:  Jasmine FudgeAutumn Nicole Taylor  20 y.o. female  PRE-OPERATIVE DIAGNOSIS:  c/s for fetal distress  POST-OPERATIVE DIAGNOSIS:  c/s for fetal distress  PROCEDURE:  Procedure(s): CESAREAN SECTION (N/A)  SURGEON:  Surgeon(s) and Role:    * Willodean RosenthalHarraway-Smith, Obrien Huskins, MD - Primary  ANESTHESIA:   epidural  EBL:  Total I/O In: 4446 [P.O.:1146; I.V.:3300] Out: 3675 [Urine:2050; Emesis/NG output:675; Blood:950]  BLOOD ADMINISTERED:none  DRAINS: none   LOCAL MEDICATIONS USED:  MARCAINE     SPECIMEN:  Source of Specimen:  placenta  DISPOSITION OF SPECIMEN:  PATHOLOGY  COUNTS:  YES  TOURNIQUET:  * No tourniquets in log *  DICTATION: .Note written in EPIC  PLAN OF CARE: Admit to inpatient   PATIENT DISPOSITION:  PACU - hemodynamically stable.   Delay start of Pharmacological VTE agent (>24hrs) due to surgical blood loss or risk of bleeding: yes  Complications : none immediate  Breannah Kratt L. Harraway-Smith, M.D., Evern CoreFACOG

## 2017-06-28 NOTE — Anesthesia Preprocedure Evaluation (Signed)
Anesthesia Evaluation  Patient identified by MRN, date of birth, ID band Patient awake    Reviewed: Allergy & Precautions, NPO status , Patient's Chart, lab work & pertinent test results  History of Anesthesia Complications Negative for: history of anesthetic complications  Airway Mallampati: II  TM Distance: >3 FB Neck ROM: Full    Dental no notable dental hx. (+) Dental Advisory Given   Pulmonary Current Smoker,    Pulmonary exam normal        Cardiovascular hypertension, negative cardio ROS Normal cardiovascular exam     Neuro/Psych PSYCHIATRIC DISORDERS Anxiety Depression Bipolar Disorder negative neurological ROS  negative psych ROS   GI/Hepatic negative GI ROS, (+) Hepatitis -, C  Endo/Other  diabetes  Renal/GU negative Renal ROS  negative genitourinary   Musculoskeletal negative musculoskeletal ROS (+)   Abdominal   Peds negative pediatric ROS (+)  Hematology negative hematology ROS (+)   Anesthesia Other Findings   Reproductive/Obstetrics (+) Pregnancy                             Anesthesia Physical Anesthesia Plan  ASA: III  Anesthesia Plan: Epidural   Post-op Pain Management:    Induction:   PONV Risk Score and Plan:   Airway Management Planned: Natural Airway  Additional Equipment:   Intra-op Plan:   Post-operative Plan:   Informed Consent: I have reviewed the patients History and Physical, chart, labs and discussed the procedure including the risks, benefits and alternatives for the proposed anesthesia with the patient or authorized representative who has indicated his/her understanding and acceptance.   Dental advisory given  Plan Discussed with: Anesthesiologist  Anesthesia Plan Comments:         Anesthesia Quick Evaluation

## 2017-06-29 LAB — CBC
HEMATOCRIT: 27.8 % — AB (ref 36.0–46.0)
HEMOGLOBIN: 10.1 g/dL — AB (ref 12.0–15.0)
MCH: 31.1 pg (ref 26.0–34.0)
MCHC: 36.3 g/dL — ABNORMAL HIGH (ref 30.0–36.0)
MCV: 85.5 fL (ref 78.0–100.0)
Platelets: 123 10*3/uL — ABNORMAL LOW (ref 150–400)
RBC: 3.25 MIL/uL — AB (ref 3.87–5.11)
RDW: 15.5 % (ref 11.5–15.5)
WBC: 19.6 10*3/uL — AB (ref 4.0–10.5)

## 2017-06-29 LAB — HIV ANTIBODY (ROUTINE TESTING W REFLEX): HIV Screen 4th Generation wRfx: NONREACTIVE

## 2017-06-29 LAB — RPR: RPR Ser Ql: NONREACTIVE

## 2017-06-29 MED ORDER — RHO D IMMUNE GLOBULIN 1500 UNIT/2ML IJ SOSY
300.0000 ug | PREFILLED_SYRINGE | Freq: Once | INTRAMUSCULAR | Status: AC
  Administered 2017-06-29: 300 ug via INTRAVENOUS
  Filled 2017-06-29: qty 2

## 2017-06-29 MED ORDER — MAGNESIUM SULFATE 40 G IN LACTATED RINGERS - SIMPLE
2.0000 g/h | INTRAVENOUS | Status: AC
  Administered 2017-06-29: 2 g/h via INTRAVENOUS
  Filled 2017-06-29 (×3): qty 500

## 2017-06-29 NOTE — Progress Notes (Signed)
Pt set up and educated with breast pump. However, patient is stating that she no longer wants to breastfeed. Carmelina DaneERRI L Francy Mcilvaine, RN

## 2017-06-29 NOTE — Lactation Note (Signed)
This note was copied from a baby's chart. Lactation Consultation Note  Patient Name: Jasmine Taylor's Date: 06/29/2017   Baby 19 hours old. Mom reports that she does not want to breastfeed or pump. Provided anticipatory guidance regarding engorgement prevention/treatment.   Maternal Data    Feeding Feeding Type: Bottle Fed - Formula Nipple Type: Slow - flow  LATCH Score/Interventions                      Lactation Tools Discussed/Used     Consult Status      Sherlyn HayJennifer D Mineola Duan 06/29/2017, 11:23 AM

## 2017-06-29 NOTE — Progress Notes (Signed)
Subjective: Postpartum Day 1: Cesarean Delivery Patient reports tolerating PO.  Pt denies pain or HA or SOB.   Last night pt was hypotensive. She was asymptomatic at the time but her BP's were consistently 60's over 40's. She had some bleeding in the PACU and a bit of increased bleeding on the floor which did not seem excessive.   Her Hgb fell fro 12>7.9. Her magnesium was stopped and she was transfused 2 units of PRBC. She reports that she does feel better this am. She is eating.   Objective: Vital signs in last 24 hours: Temp:  [97.4 F (36.3 C)-100.1 F (37.8 C)] 98.4 F (36.9 C) (07/23 0413) Pulse Rate:  [65-106] 79 (07/23 0413) Resp:  [10-29] 18 (07/23 0413) BP: (64-163)/(28-115) 130/94 (07/23 0413) SpO2:  [1 %-100 %] 97 % (07/23 0413)  I/O last 3 completed shifts: In: 7474.9 [P.O.:2466; I.V.:4339.9; Blood:669] Out: 5350 [Urine:3725; Emesis/NG output:675; Blood:950] No intake/output data recorded.  Physical Exam:  General: alert and no distress Lochia: appropriate Uterine Fundus: firm Incision: no significant drainage, min blood on dressing DVT Evaluation: No evidence of DVT seen on physical exam.  CBC Latest Ref Rng & Units 06/29/2017 06/28/2017 06/27/2017  WBC 4.0 - 10.5 K/uL 19.6(H) 13.2(H) 12.7(H)  Hemoglobin 12.0 - 15.0 g/dL 10.1(L) 7.9(L) 12.7  Hematocrit 36.0 - 46.0 % 27.8(L) 22.9(L) 36.5  Platelets 150 - 400 K/uL 123(L) 139(L) 178    Assessment/Plan: Status post Cesarean section due to FTP and fetal distress. Pt with preeclampsia with severe features. Off Magnesium currently due to episodes of hypotension and acute blood los anemia.  Will restart Mg this am.  Postoperative course complicated by postpartum hemorrhage s/p 2 units of PRBCs Continue current care. Watch BPs  Jasmine Rosenthalarolyn Harraway-Smith 06/29/2017, 7:41 AM

## 2017-06-29 NOTE — Anesthesia Postprocedure Evaluation (Signed)
Anesthesia Post Note  Patient: Jasmine FudgeAutumn Nicole Taylor  Procedure(s) Performed: Procedure(s) (LRB): CESAREAN SECTION (N/A)     Patient location during evaluation: [facility] Anesthesia Type: Epidural Level of consciousness: awake and alert and oriented Pain management: pain level controlled Vital Signs Assessment: post-procedure vital signs reviewed and stable Respiratory status: spontaneous breathing and nonlabored ventilation Cardiovascular status: stable Postop Assessment: no headache, patient able to bend at knees, no backache, no signs of nausea or vomiting, epidural receding and adequate PO intake Anesthetic complications: no    Last Vitals:  Vitals:   06/29/17 0314 06/29/17 0413  BP: 133/80 (!) 130/94  Pulse: 72 79  Resp: 18 18  Temp: 37.8 C 36.9 C    Last Pain:  Vitals:   06/29/17 0715  TempSrc:   PainSc: 3    Pain Goal: Patients Stated Pain Goal: 3 (06/29/17 0715)               Laban EmperorMalinova,Kaulin Chaves Hristova

## 2017-06-29 NOTE — Addendum Note (Signed)
Addendum  created 06/29/17 [id number] by Elgie CongoMalinova, Torre Schaumburg H, CRNA   Sign clinical note

## 2017-06-29 NOTE — Progress Notes (Signed)
Magnesium d/c and iv to s/l pt. Tol. Well

## 2017-06-29 NOTE — Progress Notes (Signed)
Magnesium restarted per order. Carmelina DaneERRI L Vassie Kugel, RN

## 2017-06-30 LAB — BPAM RBC
BLOOD PRODUCT EXPIRATION DATE: 201808062359
Blood Product Expiration Date: 201808062359
ISSUE DATE / TIME: 201807222040
ISSUE DATE / TIME: 201807230004
UNIT TYPE AND RH: 600
UNIT TYPE AND RH: 600

## 2017-06-30 LAB — TYPE AND SCREEN
ABO/RH(D): A NEG
Antibody Screen: NEGATIVE
Unit division: 0
Unit division: 0

## 2017-06-30 LAB — RH IG WORKUP (INCLUDES ABO/RH)
ABO/RH(D): A NEG
FETAL SCREEN: NEGATIVE
Gestational Age(Wks): 38
UNIT DIVISION: 0

## 2017-06-30 MED ORDER — IBUPROFEN 600 MG PO TABS: 600.0000 mg | ORAL_TABLET | Freq: Four times a day (QID) | ORAL | 2 refills | Status: DC | PRN

## 2017-06-30 MED ORDER — HYDROCHLOROTHIAZIDE 25 MG PO TABS: 25.0000 mg | ORAL_TABLET | Freq: Every day | ORAL | 3 refills | Status: DC

## 2017-06-30 MED ORDER — OXYCODONE-ACETAMINOPHEN 5-325 MG PO TABS: 1.0000 | ORAL_TABLET | Freq: Four times a day (QID) | ORAL | 0 refills | Status: DC | PRN

## 2017-06-30 MED ORDER — DOCUSATE SODIUM 100 MG PO CAPS: 100.0000 mg | ORAL_CAPSULE | Freq: Two times a day (BID) | ORAL | 2 refills | Status: DC | PRN

## 2017-06-30 NOTE — Clinical Social Work Maternal (Signed)
CLINICAL SOCIAL WORK MATERNAL/CHILD NOTE  Patient Details  Name: Jasmine Taylor MRN: [id number] Date of Birth: 01/12/2004  Date:  06/30/2017  Clinical Social Worker Initiating Note:  Tenna Lacko, LCSW Date/ Time Initiated:  06/30/17/1100     Child's Name:  Jasmine Taylor   Legal Guardian:  Other (Comment) (Parents: Jasmine Taylor and Jasmine Taylor)   Need for Interpreter:  None   Date of Referral:  06/29/17     Reason for Referral:  Current Substance Use/Substance Use During Pregnancy , Behavioral Health Issues, including SI    Referral Source:  Central Nursery   Address:  [address] [place], [place] [address]  Phone number:  [phone]   Household Members:  Spouse   Natural Supports (not living in the home):  Extended Family (MOB reports that her uncle and grandmother are supportive, but live in [place].  FOB has a supportive aunt in [place] and a "somewhat" supportive cousin in [place].)   Professional Supports: None (MOB is interested in resuming counseling and plans to restart services at [facility].)   Employment:     Type of Work: FOB works at [employer].  MOB wants to go back to school at [employer] "to either work with children or be a doctor to deliver babies."  FOB wants to for to [employer] for Culinary and to [employer] for Cosmetology.   Education:   (MOB states plans to obtain her GED)   Financial Resources:  Medicaid   Other Resources:  Food Stamps , WIC   Cultural/Religious Considerations Which May Impact Care: None stated.  Strengths:  Ability to meet basic needs , Pediatrician chosen , Home prepared for child  ([facility])   Risk Factors/Current Problems:  Substance Use , Mental Health Concerns    Cognitive State:  Able to Concentrate , Alert , Linear Thinking , Goal Oriented    Mood/Affect:  Calm , Comfortable , Interested    CSW Assessment: CSW met with parents in MOB's 3rd floor room to offer support  and complete assessment due hx of Anxiety, Bipolar and marijuana use.  Baby's UDS is positive for THC.  Parents were pleasant and welcoming of CSW's visit.  FOB was in and out of the room, but MOB gave permission to speak openly in front of her husband and stated, "he knows everything.  He knows me better than I know me."  FOB appeared involved and supportive.  CSW observed both parents interacting lovingly with baby and bonding seems evident.   MOB reports that she is sore, but that delivery went ok.  She states baby is doing very well and that they are ready to go home today.  MOB reports that this is her and her husband's first baby together, but that she has a 7 year old son.  FOB reports that he has 4 other children.  MOB states she was "staying in a trap house" in [place] and made plans with her uncle to care for her child since her environment was not healthy for him.  She states her son has lived with her uncle for approximately 1.5 years and that she is "trying to get him back."  She denies CPS involvement regarding this plan.  CSW asked what she needs to do to "get him back."  MOB replied that she just needs to find a way to get to [place] to pick him up.  MOB reports that his father is not involved.  FOB states his 4   children live in [place] and are between 2 different mothers.  He reports that he has a relationship with all of his children and that his oldest was here for a period of time earlier in the summer.  He too denies CPS involvement.   Parents report that they are living at the [facility].  CSW inquired about current substance use, as CSW knows this to be a homeless/SA program.  MOB reports no drug use since moving to [place] about a year ago, but states "I was running the streets back home."  CSW asked about marijuana use and informed parents that baby's UDS is positive for THC.  Parents report that they used to use cocaine, but quit a year ago.   MOB admits to smoking marijuana during the pregnancy to increase appetite.  CSW informed parents of hospital drug screen policy and mandated reporting.  FOB denies all substance use.  CSW inquired as to whether MOB feels she is getting the right level of care and support regarding her recovery and she said yes.  Parents were understanding and cooperative regarding CPS report.  CSW informed them that CSW would like to hear back from CPS Intake as to whether or not the worker would like to meet with them at the hospital or at home to initiate the case before they discharge.  They agreed.   CSW provided education regarding PMADs and gave information about support groups held at [facility].  CSW recommends self-evaluation with the New Mom Checklist from Postpartum Progress.  CSW inquired about how parents are feeling emotionally and about dxs of Bipolar and Anxiety noted in MOB's chart.  MOB acknowledges these dxs and states she is prescribed psychotropic medication by her PCP Dr. Pavelock on [address].  CSW believes this provider is at [facility], but MOB was unsure of the name of the practice.  She states she was taking Buspar, Adderall, and Latuda prior to pregnancy and wants to resume medication as soon as possible.  She plans to make an appointment with her doctor as soon as she feels healed from surgery.  CSW recommends coupling medication with counseling and MOB is interested in restarting counseling.  She states she has gone to [facility] in the past.  CSW gave her information about their walk in clinic and MOB plans to go.  FOB reports that he also has been diagnosed with Bipolar, but does not take medication.  MOB feels he should consider it, but he is not sure.  He states willingness to return to the [facility] for re-evaluation and mental health services.  CSW commends them for their willingness to address mental health concerns.  They  both report no concerning symptoms currently. CSW made report to [place] CPS.  CSW then called back to see who case has been assigned to and was told that Marquita Rainer has the case.  CSW spoke with Me. Rainer who states she wants to meet with parents at the hotel, but was unable to reach them at the hospital on their cell phone.  CSW called Ms. Rainer from the hospital room to get her in touch with parents.   Baby is now cleared for discharge with parents and CPS worker will follow up in the home.  Staff notified.  CSW Plan/Description:  Child Protective Service Report , Information/Referral to Community Resources , No Further Intervention Required/No Barriers to Discharge,   Patient/Family Education     Alphonzo Cruise, [place] 05-16-17, 1:45 PM

## 2017-06-30 NOTE — Discharge Summary (Signed)
OB Discharge Summary     Patient Name: Jasmine Taylor DOB: 1996/06/11 MRN: [id number]  Date of admission: 06/27/2017 Delivering MD: Lavonia Drafts   Date of discharge: 06/30/2017  Admitting diagnosis: 38wks contractions  Intrauterine pregnancy: [redacted]w[redacted]d    Secondary diagnosis:  Active Problems:   Preeclampsia, severe, third trimester   Fetal distress affecting labor    Discharge diagnosis: Term Pregnancy Delivered, symptomatic anemia and GDM A1                                                                              Post partum procedures:blood transfusion of 2 units pRBCs  Augmentation: AROM and Pitocin  Complications: None  Hospital course:  Onset of Labor With Unplanned C/S  21y.o. yo [id number] 310w1das admitted in Latent Labor on 06/27/2017 and had severe preeclampsia.  Was started on magnesium sulfate.  Patient had a labor course significant for fetal distress. Membrane Rupture Time/Date: 9:35 AM ,06/28/2017   The patient went for cesarean section due to Non-Reassuring FHR, and delivered a Viable infant,06/28/2017. Details of operation can be found in separate operative note. Patient had an complicated postpartum course; had magnesium sulfate for 24 hours and received 2 units of pRBCs for symptomatic anemia.  Her hemoglobin increased to 10.1. On POD#2, she is ambulating, tolerating a regular diet, passing flatus, and urinating well. She was started on HCTZ for BP control.  Patient is discharged home in stable condition 06/30/17.  Physical exam  Vitals:   06/29/17 2100 06/30/17 0040 06/30/17 0345 06/30/17 0830  BP:  125/75 134/82 135/81  Pulse:  89 77 92  Resp: 18 17 18 18   Temp:  98.5 F (36.9 C) 98.6 F (37 C) 98.4 F (36.9 C)  TempSrc:  Oral Oral Oral  SpO2: 99% 100%  100%  Weight:      Height:       General: alert and no distress Lochia: appropriate Uterine Fundus: firm Incision: Healing well with no significant drainage, Dressing is clean,  dry, and intact DVT Evaluation: No evidence of DVT seen on physical exam. Negative Homan's sign. Labs: Lab Results  Component Value Date   WBC 19.6 (H) 06/29/2017   HGB 10.1 (L) 06/29/2017   HCT 27.8 (L) 06/29/2017   MCV 85.5 06/29/2017   PLT 123 (L) 06/29/2017   CMP Latest Ref Rng & Units 06/27/2017  Glucose 65 - 99 mg/dL 98  BUN 6 - 20 mg/dL 8  Creatinine 0.44 - 1.00 mg/dL 0.62  Sodium 135 - 145 mmol/L 136  Potassium 3.5 - 5.1 mmol/L 3.5  Chloride 101 - 111 mmol/L 106  CO2 22 - 32 mmol/L 23  Calcium 8.9 - 10.3 mg/dL 9.2  Total Protein 6.5 - 8.1 g/dL 6.1(L)  Total Bilirubin 0.3 - 1.2 mg/dL 0.7  Alkaline Phos 38 - 126 U/L 164(H)  AST 15 - 41 U/L 36  ALT 14 - 54 U/L 26    Discharge instruction: per After Visit Summary and "Baby and Me Booklet".  After visit meds:  Allergies as of 06/30/2017      Reactions   Influenza A (h1n1) Monovalent Vaccine Swelling   Latex Itching, Rash  Medication List    STOP taking these medications   ACCU-CHEK FASTCLIX LANCETS Misc   ACCU-CHEK NANO SMARTVIEW w/Device Kit   acetaminophen 500 MG tablet Commonly known as:  TYLENOL   aspirin EC 81 MG tablet   butalbital-acetaminophen-caffeine 50-325-40 MG tablet Commonly known as:  FIORICET, ESGIC   glucose blood test strip Commonly known as:  ACCU-CHEK SMARTVIEW   pantoprazole 20 MG tablet Commonly known as:  PROTONIX   VITAFOL GUMMIES 3.33-0.333-34.8 MG Chew     TAKE these medications   calcium carbonate 500 MG chewable tablet Commonly known as:  TUMS - dosed in mg elemental calcium Chew 2 tablets by mouth daily as needed for indigestion or heartburn.   docusate sodium 100 MG capsule Commonly known as:  COLACE Take 1 capsule (100 mg total) by mouth 2 (two) times daily as needed.   hydrochlorothiazide 25 MG tablet Commonly known as:  HYDRODIURIL Take 1 tablet (25 mg total) by mouth daily.   ibuprofen 600 MG tablet Commonly known as:  ADVIL,MOTRIN Take 1 tablet (600 mg  total) by mouth every 6 (six) hours as needed for headache, mild pain or moderate pain.   oxyCODONE-acetaminophen 5-325 MG tablet Commonly known as:  PERCOCET/ROXICET Take 1-2 tablets by mouth every 6 (six) hours as needed.       Diet: routine diet  Activity: Advance as tolerated. Pelvic rest for 6 weeks.   Outpatient follow up: 1 week for BP check Follow up Appt:Future Appointments Date Time Provider [place]  07/28/2017 1:00 PM Woodroe Mode, MD [place] None   Postpartum contraception: Combination OCPs  Newborn Data: Live born female  Birth Weight: 5 lb 10.1 oz (2555 g) APGAR: 8, 9  Baby Feeding: Bottle and Breast Disposition:home with mother   06/30/2017 Verita Schneiders, MD

## 2017-06-30 NOTE — Discharge Instructions (Signed)
Cesarean Delivery, Care After °Refer to this sheet in the next few weeks. These instructions provide you with information about caring for yourself after your procedure. Your health care provider may also give you more specific instructions. Your treatment has been planned according to current medical practices, but problems sometimes occur. Call your health care provider if you have any problems or questions after your procedure. °What can I expect after the procedure? °After the procedure, it is common to have: °· A small amount of blood or clear fluid coming from the incision. °· Some redness, swelling, and pain in your incision area. °· Some abdominal pain and soreness. °· Vaginal bleeding (lochia). °· Pelvic cramps. °· Fatigue. ° °Follow these instructions at home: °Incision care ° °· Follow instructions from your health care provider about how to take care of your incision. Make sure you: °? Wash your hands with soap and water before you change your bandage (dressing). If soap and water are not available, use hand sanitizer. °? Change your dressing as told by your health care provider. °? Leave stitches (sutures), skin staples, skin glue, or adhesive strips in place. These skin closures may need to stay in place for 2 weeks or longer. If adhesive strip edges start to loosen and curl up, you may trim the loose edges. Do not remove adhesive strips completely unless your health care provider tells you to do that. °· Check your incision area every day for signs of infection. Check for: °? More redness, swelling, or pain. °? More fluid or blood. °? Warmth. °? Pus or a bad smell. °· When you cough or sneeze, hug a pillow. This helps with pain and decreases the chance of your incision opening up (dehiscing). Do this until your incision heals. °Medicines °· Take over-the-counter and prescription medicines only as told by your health care provider. °· If you were prescribed an antibiotic medicine, take it as told by  your health care provider. Do not stop taking the antibiotic until it is finished. °Driving °· Do not drive or operate heavy machinery while taking prescription pain medicine. °· Do not drive for 24 hours if you received a sedative. °Lifestyle °· Do not drink alcohol. This is especially important if you are breastfeeding or taking pain medicine. °· Do not use tobacco products, including cigarettes, chewing tobacco, or e-cigarettes. If you need help quitting, ask your health care provider. Tobacco can delay wound healing. °Eating and drinking °· Drink at least 8 eight-ounce glasses of water every day unless told not to by your health care provider. If you breastfeed, you may need to drink more water than this. °· Eat high-fiber foods every day. These foods may help prevent or relieve constipation. High-fiber foods include: °? Whole grain cereals and breads. °? Brown rice. °? Beans. °? Fresh fruits and vegetables. °Activity °· Return to your normal activities as told by your health care provider. Ask your health care provider what activities are safe for you. °· Rest as much as possible. Try to rest or take a nap while your baby is sleeping. °· Do not lift anything that is heavier than your baby or 10 lb (4.5 kg) as told by your health care provider. °· Ask your health care provider when you can engage in sexual activity. This may depend on your: °? Risk of infection. °? Healing rate. °? Comfort and desire to engage in sexual activity. °Bathing °· Do not take baths, swim, or use a hot tub until your health care   provider approves. Ask your health care provider if you can take showers. You may only be allowed to take sponge baths until your incision heals. °· Keep your dressing dry as told by your health care provider. °General instructions °· Do not use tampons or douches until your health care provider approves. °· Wear: °? Loose, comfortable clothing. °? A supportive and well-fitting bra. °· Watch for any blood clots  that may pass from your vagina. These may look like clumps of dark red, brown, or black discharge. °· Keep your perineum clean and dry as told by your health care provider. °· Wipe from front to back when you use the toilet. °· If possible, have someone help you care for your baby and help with household activities for a few days after you leave the hospital. °· Keep all follow-up visits for you and your baby as told by your health care provider. This is important. °Contact a health care provider if: °· You have: °? Bad-smelling vaginal discharge. °? Difficulty urinating. °? Pain when urinating. °? A sudden increase or decrease in the frequency of your bowel movements. °? More redness, swelling, or pain around your incision. °? More fluid or blood coming from your incision. °? Pus or a bad smell coming from your incision. °? A fever. °? A rash. °? Little or no interest in activities you used to enjoy. °? Questions about caring for yourself or your baby. °? Nausea. °· Your incision feels warm to the touch. °· Your breasts turn red or become painful or hard. °· You feel unusually sad or worried. °· You vomit. °· You pass large blood clots from your vagina. If you pass a blood clot, save it to show to your health care provider. Do not flush blood clots down the toilet without showing your health care provider. °· You urinate more than usual. °· You are dizzy or light-headed. °· You have not breastfed and have not had a menstrual period for 12 weeks after delivery. °· You stopped breastfeeding and have not had a menstrual period for 12 weeks after stopping breastfeeding. °Get help right away if: °· You have: °? Pain that does not go away or get better with medicine. °? Chest pain. °? Difficulty breathing. °? Blurred vision or spots in your vision. °? Thoughts about hurting yourself or your baby. °? New pain in your abdomen or in one of your legs. °? A severe headache. °· You faint. °· You bleed from your vagina so much  that you fill two sanitary pads in one hour. °This information is not intended to replace advice given to you by your health care provider. Make sure you discuss any questions you have with your health care provider. °Document Released: 08/16/2002 Document Revised: 04/03/2016 Document Reviewed: 10/29/2015 °Elsevier Interactive Patient Education © 2017 Elsevier Inc. ° ° ° °Preeclampsia and Eclampsia °Preeclampsia is a serious condition that develops only during pregnancy. It is also called toxemia of pregnancy. This condition causes high blood pressure along with other symptoms, such as swelling and headaches. These symptoms may develop as the condition gets worse. Preeclampsia may occur at 20 weeks of pregnancy or later. °Diagnosing and treating preeclampsia early is very important. If not treated early, it can cause serious problems for you and your baby. One problem it can lead to is eclampsia, which is a condition that causes muscle jerking or shaking (convulsions or seizures) in the mother. Delivering your baby is the best treatment for preeclampsia or eclampsia. Preeclampsia   and eclampsia symptoms usually go away after your baby is born. °What are the causes? °The cause of preeclampsia is not known. °What increases the risk? °The following risk factors make you more likely to develop preeclampsia: °· Being pregnant for the first time. °· Having had preeclampsia during a past pregnancy. °· Having a family history of preeclampsia. °· Having high blood pressure. °· Being pregnant with twins or triplets. °· Being 35 or older. °· Being African-American. °· Having kidney disease or diabetes. °· Having medical conditions such as lupus or blood diseases. °· Being very overweight (obese). ° °What are the signs or symptoms? °The earliest signs of preeclampsia are: °· High blood pressure. °· Increased protein in your urine. Your health care provider will check for this at every visit before you give birth (prenatal  visit). ° °Other symptoms that may develop as the condition gets worse include: °· Severe headaches. °· Sudden weight gain. °· Swelling of the hands, face, legs, and feet. °· Nausea and vomiting. °· Vision problems, such as blurred or double vision. °· Numbness in the face, arms, legs, and feet. °· Urinating less than usual. °· Dizziness. °· Slurred speech. °· Abdominal pain, especially upper abdominal pain. °· Convulsions or seizures. ° °Symptoms generally go away after giving birth. °How is this diagnosed? °There are no screening tests for preeclampsia. Your health care provider will ask you about symptoms and check for signs of preeclampsia during your prenatal visits. You may also have tests that include: °· Urine tests. °· Blood tests. °· Checking your blood pressure. °· Monitoring your baby’s heart rate. °· Ultrasound. ° °How is this treated? °You and your health care provider will determine the treatment approach that is best for you. Treatment may include: °· Having more frequent prenatal exams to check for signs of preeclampsia, if you have an increased risk for preeclampsia. °· Bed rest. °· Reducing how much salt (sodium) you eat. °· Medicine to lower your blood pressure. °· Staying in the hospital, if your condition is severe. There, treatment will focus on controlling your blood pressure and the amount of fluids in your body (fluid retention). °· You may need to take medicine (magnesium sulfate) to prevent seizures. This medicine may be given as an injection or through an IV tube. °· Delivering your baby early, if your condition gets worse. You may have your labor started with medicine (induced), or you may have a cesarean delivery. ° °Follow these instructions at home: °Eating and drinking ° °· Drink enough fluid to keep your urine clear or pale yellow. °· Eat a healthy diet that is low in sodium. Do not add salt to your food. Check nutrition labels to see how much sodium a food or beverage  contains. °· Avoid caffeine. °Lifestyle °· Do not use any products that contain nicotine or tobacco, such as cigarettes and e-cigarettes. If you need help quitting, ask your health care provider. °· Do not use alcohol or drugs. °· Avoid stress as much as possible. Rest and get plenty of sleep. °General instructions °· Take over-the-counter and prescription medicines only as told by your health care provider. °· When lying down, lie on your side. This keeps pressure off of your baby. °· When sitting or lying down, raise (elevate) your feet. Try putting some pillows underneath your lower legs. °· Exercise regularly. Ask your health care provider what kinds of exercise are best for you. °· Keep all follow-up and prenatal visits as told by your health care provider.   This is important. °How is this prevented? °To prevent preeclampsia or eclampsia from developing during another pregnancy: °· Get proper medical care during pregnancy. Your health care provider may be able to prevent preeclampsia or diagnose and treat it early. °· Your health care provider may have you take a low-dose aspirin or a calcium supplement during your next pregnancy. °· You may have tests of your blood pressure and kidney function after giving birth. °· Maintain a healthy weight. Ask your health care provider for help managing weight gain during pregnancy. °· Work with your health care provider to manage any long-term (chronic) health conditions you have, such as diabetes or kidney problems. ° °Contact a health care provider if: °· You gain more weight than expected. °· You have headaches. °· You have nausea or vomiting. °· You have abdominal pain. °· You feel dizzy or light-headed. °Get help right away if: °· You develop sudden or severe swelling anywhere in your body. This usually happens in the legs. °· You gain 5 lbs (2.3 kg) or more during one week. °· You have severe: °? Abdominal pain. °? Headaches. °? Dizziness. °? Vision  problems. °? Confusion. °? Nausea or vomiting. °· You have a seizure. °· You have trouble moving any part of your body. °· You develop numbness in any part of your body. °· You have trouble speaking. °· You have any abnormal bleeding. °· You pass out. °This information is not intended to replace advice given to you by your health care provider. Make sure you discuss any questions you have with your health care provider. °Document Released: 11/21/2000 Document Revised: 07/22/2016 Document Reviewed: 06/30/2016 °Elsevier Interactive Patient Education © 2018 Elsevier Inc. ° °

## 2017-06-30 NOTE — Progress Notes (Signed)
Pt discharged to home with significant other and newborn.  Condition stable.  Discharge instructions reviewed with pt and SO together including sx of HTN, medications, and reasons to seek follow-up care between appointments.   Pt and SO both verbalized understanding.  Family ambulated to bus stop with B. Daphine DeutscherMartin, [place] with plans to ride the bus home.  No equipment for home ordered at discharge.

## 2017-07-28 ENCOUNTER — Ambulatory Visit: Payer: Medicaid Other | Admitting: Obstetrics & Gynecology

## 2017-09-16 ENCOUNTER — Other Ambulatory Visit: Payer: Self-pay | Admitting: Obstetrics & Gynecology

## 2017-10-15 ENCOUNTER — Other Ambulatory Visit: Payer: Self-pay | Admitting: Obstetrics & Gynecology

## 2018-04-22 ENCOUNTER — Encounter: Payer: Medicaid Other | Admitting: Obstetrics and Gynecology

## 2018-04-23 ENCOUNTER — Telehealth: Payer: Self-pay | Admitting: *Deleted

## 2018-04-23 NOTE — Telephone Encounter (Signed)
Attempted to call patient to reschedule missed New OB appointment or to let us know her plans, phone stating error message call rejected.Marland KitchenMarland Kitchen

## 2018-05-05 ENCOUNTER — Encounter: Payer: Medicaid Other | Admitting: Obstetrics

## 2018-05-13 ENCOUNTER — Telehealth: Payer: Self-pay | Admitting: *Deleted

## 2018-05-13 NOTE — Telephone Encounter (Signed)
Attempted to call patient to reschedule Missed New Ob appt, unable to leave a msg recording states call rejected, phone numbers need to be updated.Marland Kitchen..Marland Kitchen

## 2018-05-14 ENCOUNTER — Inpatient Hospital Stay (HOSPITAL_COMMUNITY)
Admission: AD | Admit: 2018-05-14 | Discharge: 2018-05-14 | Disposition: A | Discharge status: Home / Self Care | Payer: Medicaid Other | Source: Ambulatory Visit | Attending: Obstetrics & Gynecology | Admitting: Obstetrics & Gynecology

## 2018-05-14 ENCOUNTER — Inpatient Hospital Stay (HOSPITAL_COMMUNITY): Payer: Medicaid Other

## 2018-05-14 ENCOUNTER — Encounter (HOSPITAL_COMMUNITY): Payer: Self-pay | Admitting: *Deleted

## 2018-05-14 DIAGNOSIS — A5901 Trichomonal vulvovaginitis: Secondary | ICD-10-CM | POA: Insufficient documentation

## 2018-05-14 DIAGNOSIS — O26892 Other specified pregnancy related conditions, second trimester: Secondary | ICD-10-CM | POA: Diagnosis not present

## 2018-05-14 DIAGNOSIS — R109 Unspecified abdominal pain: Secondary | ICD-10-CM | POA: Diagnosis not present

## 2018-05-14 DIAGNOSIS — F1290 Cannabis use, unspecified, uncomplicated: Secondary | ICD-10-CM

## 2018-05-14 DIAGNOSIS — O98312 Other infections with a predominantly sexual mode of transmission complicating pregnancy, second trimester: Secondary | ICD-10-CM | POA: Insufficient documentation

## 2018-05-14 DIAGNOSIS — O23592 Infection of other part of genital tract in pregnancy, second trimester: Secondary | ICD-10-CM

## 2018-05-14 DIAGNOSIS — O26899 Other specified pregnancy related conditions, unspecified trimester: Secondary | ICD-10-CM

## 2018-05-14 DIAGNOSIS — Z3A18 18 weeks gestation of pregnancy: Secondary | ICD-10-CM | POA: Diagnosis not present

## 2018-05-14 LAB — URINALYSIS, ROUTINE W REFLEX MICROSCOPIC
Bilirubin Urine: NEGATIVE
Glucose, UA: NEGATIVE mg/dL
Hgb urine dipstick: NEGATIVE
KETONES UR: NEGATIVE mg/dL
Nitrite: NEGATIVE
PROTEIN: NEGATIVE mg/dL
Specific Gravity, Urine: 1.004 — ABNORMAL LOW (ref 1.005–1.030)
WBC, UA: >50 WBC/hpf — ABNORMAL HIGH (ref 0–5)
pH: 7 (ref 5.0–8.0)

## 2018-05-14 LAB — CBC WITH DIFFERENTIAL/PLATELET
BASOS ABS: 0 10*3/uL (ref 0.0–0.1)
BASOS PCT: 0 %
Eosinophils Absolute: 0 10*3/uL (ref 0.0–0.7)
Eosinophils Relative: 0 %
HEMATOCRIT: 35.7 % — AB (ref 36.0–46.0)
HEMOGLOBIN: 12.4 g/dL (ref 12.0–15.0)
Lymphocytes Relative: 20 %
Lymphs Abs: 1.8 10*3/uL (ref 0.7–4.0)
MCH: 30.6 pg (ref 26.0–34.0)
MCHC: 34.7 g/dL (ref 30.0–36.0)
MCV: 88.1 fL (ref 78.0–100.0)
Monocytes Absolute: 0.3 10*3/uL (ref 0.1–1.0)
Monocytes Relative: 3 %
NEUTROS ABS: 7 10*3/uL (ref 1.7–7.7)
Neutrophils Relative %: 77 %
Platelets: 237 10*3/uL (ref 150–400)
RBC: 4.05 MIL/uL (ref 3.87–5.11)
RDW: 14 % (ref 11.5–15.5)
WBC: 9.1 10*3/uL (ref 4.0–10.5)

## 2018-05-14 LAB — RAPID URINE DRUG SCREEN, HOSP PERFORMED
Amphetamines: NOT DETECTED
BENZODIAZEPINES: NOT DETECTED
Barbiturates: NOT DETECTED
COCAINE: NOT DETECTED
OPIATES: NOT DETECTED
Tetrahydrocannabinol: POSITIVE — AB

## 2018-05-14 LAB — WET PREP, GENITAL
Clue Cells Wet Prep HPF POC: NONE SEEN
SPERM: NONE SEEN

## 2018-05-14 MED ORDER — METRONIDAZOLE 500 MG PO TABS: ORAL_TABLET | ORAL | 0 refills | Status: DC

## 2018-05-14 NOTE — MAU Provider Note (Signed)
History     CSN: [id number]  Arrival date and time: 05/14/18 1051   None     Chief Complaint  Patient presents with  . Abdominal Pain  . Possible Pregnancy   HPI Jasmine Taylor is 22 y.o.  G8 [phone]  Difficult to get exact Gs and Ps because she kept changing the numbers of pregnancies.  Unknown weeks presenting with contractions that began several weeks ago. Cramping is intermittent rating pain this am 10/10.  She said it got worse today so she called the EMS. She does not look uncomfortable during visit.  Neg for vaginal bleeding. Seems a little anxious.  + for fetal movement.  She delivered here July 2018 and states she had one period after that.   She delivered here July 2018.  She is a patient of [facility].  Not seen yet for this pregnancy.  Stopped Cocaine 1 week ago.  Smokes marijuana daily to help her appetite.    Past Medical History:  Diagnosis Date  . ADHD (attention deficit hyperactivity disorder)   . Anemia   . Anxiety   . Depression   . Hepatitis C   . Pregnancy induced hypertension     Past Surgical History:  Procedure Laterality Date  . CESAREAN SECTION    . CESAREAN SECTION N/A 06/28/2017   Procedure: CESAREAN SECTION;  Surgeon: Willodean RosenthalHarraway-Smith, Carolyn, MD;  [facility];  Service: Obstetrics;  Laterality: N/A;    No family history on file.  Social History   Tobacco Use  . Smoking status: Current Every Day Smoker    Packs/day: 0.50    Types: Cigarettes  . Smokeless tobacco: Never Used  Substance Use Topics  . Alcohol use: No  . Drug use: No    Allergies:  Allergies  Allergen Reactions  . Influenza A (H1n1) Monovalent Vaccine Swelling  . Latex Itching and Rash    Medications Prior to Admission  Medication Sig Dispense Refill Last Dose  . acetaminophen (TYLENOL) 500 MG tablet Take 500 mg by mouth every 6 (six) hours as needed for headache.   05/13/2018 at Unknown time    Review of Systems  Constitutional: Negative for chills  and fever.  Respiratory: Negative for shortness of breath.   Cardiovascular: Negative for chest pain.  Gastrointestinal: Positive for abdominal pain ("contraction" ). Negative for nausea and vomiting.  Genitourinary: Negative for dysuria, flank pain, hematuria, vaginal bleeding and vaginal discharge.  Psychiatric/Behavioral: The patient is nervous/anxious.    Physical Exam   Blood pressure 125/80, pulse (!) 102, temperature 97.8 F (36.6 C), temperature source Oral, resp. rate 16, SpO2 100 %, unknown if currently breastfeeding.  Physical Exam  Constitutional: She is oriented to person, place, and time. She appears well-developed and well-nourished. No distress.  HENT:  Head: Normocephalic.  Neck: Normal range of motion.  GI: Soft. There is no tenderness.  Genitourinary: There is no rash, tenderness or lesion on the right labia. There is no rash, tenderness or lesion on the left labia. Uterus is enlarged (18-19  week size). Cervix exhibits discharge. Cervix exhibits no motion tenderness and no friability. Vaginal discharge (moderate amount of vagial discharge that is yellow thick with mild odor) found.  Neurological: She is alert and oriented to person, place, and time.  Skin: Skin is warm and dry.  Psychiatric: Thought content normal.  Seems anxious   Results for orders placed or performed during the hospital encounter of 05/14/18 (from the past 24 hour(s))  Urine rapid drug screen (  hosp performed)     Status: Abnormal   Collection Time: 05/14/18 10:55 AM  Result Value Ref Range   Opiates NONE DETECTED NONE DETECTED   Cocaine NONE DETECTED NONE DETECTED   Benzodiazepines NONE DETECTED NONE DETECTED   Amphetamines NONE DETECTED NONE DETECTED   Tetrahydrocannabinol POSITIVE (A) NONE DETECTED   Barbiturates NONE DETECTED NONE DETECTED  Urinalysis, Routine w reflex microscopic     Status: Abnormal   Collection Time: 05/14/18 11:07 AM  Result Value Ref Range   Color, Urine STRAW (A)  YELLOW   APPearance HAZY (A) CLEAR   Specific Gravity, Urine 1.004 (L) 1.005 - 1.030   pH 7.0 5.0 - 8.0   Glucose, UA NEGATIVE NEGATIVE mg/dL   Hgb urine dipstick NEGATIVE NEGATIVE   Bilirubin Urine NEGATIVE NEGATIVE   Ketones, ur NEGATIVE NEGATIVE mg/dL   Protein, ur NEGATIVE NEGATIVE mg/dL   Nitrite NEGATIVE NEGATIVE   Leukocytes, UA LARGE (A) NEGATIVE   RBC / HPF 6-10 0 - 5 RBC/hpf   WBC, UA >50 (H) 0 - 5 WBC/hpf   Bacteria, UA RARE (A) NONE SEEN   Squamous Epithelial / LPF 0-5 0 - 5  Wet prep, genital     Status: Abnormal   Collection Time: 05/14/18 12:30 PM  Result Value Ref Range   Yeast Wet Prep HPF POC PRESENT (A) NONE SEEN   Trich, Wet Prep PRESENT (A) NONE SEEN   Clue Cells Wet Prep HPF POC NONE SEEN NONE SEEN   WBC, Wet Prep HPF POC MANY (A) NONE SEEN   Sperm NONE SEEN   CBC with Differential/Platelet     Status: Abnormal (Preliminary result)   Collection Time: 05/14/18  1:02 PM  Result Value Ref Range   WBC 9.1 4.0 - 10.5 K/uL   RBC 4.05 3.87 - 5.11 MIL/uL   Hemoglobin 12.4 12.0 - 15.0 g/dL   HCT 16.1 (L) 09.6 - 04.5 %   MCV 88.1 78.0 - 100.0 fL   MCH 30.6 26.0 - 34.0 pg   MCHC 34.7 30.0 - 36.0 g/dL   RDW 40.9 81.1 - 91.4 %   Platelets 237 150 - 400 K/uL   Neutrophils Relative % 77 %   Neutro Abs 7.0 1.7 - 7.7 K/uL   Lymphocytes Relative 20 %   Lymphs Abs 1.8 0.7 - 4.0 K/uL   Monocytes Relative 3 %   Monocytes Absolute 0.3 0.1 - 1.0 K/uL   Eosinophils Relative 0 %   Eosinophils Absolute 0.0 0.0 - 0.7 K/uL   Basophils Relative 0 %   Basophils Absolute 0.0 0.0 - 0.1 K/uL   Other PENDING %   U/S report:  [redacted]w[redacted]d with EDD 10/09/2018                     FHR 150bpm                     Cervical length 3.11cm MAU Course  Procedures  MDM MSE Exam [place]  Assessment and Plan  A:  Abdominal pain in second trimester pregnancy      Trichomonal Vaginitis P:  Rx for Flagyl to pharmacy. Ex[edited Rx for husband      She plans to call [facility] on Monday to schedule OB  care.        Instructed to begin Prenatal Vitamins    Encouraged to d/c use of all illegal drugs  Eve M Ellyce Lafevers 05/14/2018, 2:02 PM

## 2018-05-14 NOTE — Discharge Instructions (Signed)
Trichomoniasis °Trichomoniasis is an STI (sexually transmitted infection) that can affect both women and men. In women, the outer area of the female genitalia (vulva) and the vagina are affected. In men, the penis is mainly affected, but the prostate and other reproductive organs can also be involved. This condition can be treated with medicine. It often has no symptoms (is asymptomatic), especially in men. °What are the causes? °This condition is caused by an organism called Trichomonas vaginalis. Trichomoniasis most often spreads from person to person (is contagious) through sexual contact. °What increases the risk? °The following factors may make you more likely to develop this condition: °· Having unprotected sexual intercourse. °· Having sexual intercourse with a partner who has trichomoniasis. °· Having multiple sexual partners. °· Having had previous trichomoniasis infections or other STIs. ° °What are the signs or symptoms? °In women, symptoms of trichomoniasis include: °· Abnormal vaginal discharge that is clear, white, gray, or yellow-green and foamy and has an unusual "fishy" odor. °· Itching and irritation of the vagina and vulva. °· Burning or pain during urination or sexual intercourse. °· Genital redness and swelling. ° °In men, symptoms of trichomoniasis include: °· Penile discharge that may be foamy or contain pus. °· Pain in the penis. This may happen only when urinating. °· Itching or irritation inside the penis. °· Burning after urination or ejaculation. ° °How is this diagnosed? °In women, this condition may be found during a routine Pap test or physical exam. It may be found in men during a routine physical exam. Your health care provider may perform tests to help diagnose this infection, such as: °· Urine tests (men and women). °· The following in women: °? Testing the pH of the vagina. °? A vaginal swab test that checks for the Trichomonas vaginalis organism. °? Testing vaginal  secretions. ° °Your health care provider may test you for other STIs, including HIV (human immunodeficiency virus). °How is this treated? °This condition is treated with medicine taken by mouth (orally), such as metronidazole or tinidazole to fight the infection. Your sexual partner(s) may also need to be tested and treated. °· If you are a woman and you plan to become pregnant or think you may be pregnant, tell your health care provider right away. Some medicines that are used to treat the infection should not be taken during pregnancy. ° °Your health care provider may recommend over-the-counter medicines or creams to help relieve itching or irritation. You may be tested for infection again 3 months after treatment. °Follow these instructions at home: °· Take and use over-the-counter and prescription medicines, including creams, only as told by your health care provider. °· Do not have sexual intercourse until one week after you finish your medicine, or until your health care provider approves. Ask your health care provider when you may resume sexual intercourse. °· (Women) Do not douche or wear tampons while you have the infection. °· Discuss your infection with your sexual partner(s). Make sure that your partner gets tested and treated, if necessary. °· Keep all follow-up visits as told by your health care provider. This is important. °How is this prevented? °· Use condoms every time you have sex. Using condoms correctly and consistently can help protect against STIs. °· Avoid having multiple sexual partners. °· Talk with your sexual partner about any symptoms that either of you may have, as well as any history of STIs. °· Get tested for STIs and STDs (sexually transmitted diseases) before you have sex. Ask your partner   to do the same.  Do not have sexual contact if you have symptoms of trichomoniasis or another STI. Contact a health care provider if:  You still have symptoms after you finish your  medicine.  You develop pain in your abdomen.  You have pain when you urinate.  You have bleeding after sexual intercourse.  You develop a rash.  You feel nauseous or you vomit.  You plan to become pregnant or think you may be pregnant. Summary  Trichomoniasis is an STI (sexually transmitted infection) that can affect both women and men.  This condition often has no symptoms (is asymptomatic), especially in men.  You should not have sexual intercourse until one week after you finish your medicine, or until your health care provider approves. Ask your health care provider when you may resume sexual intercourse.  Discuss your infection with your sexual partner. Make sure that your partner gets tested and treated, if necessary. This information is not intended to replace advice given to you by your health care provider. Make sure you discuss any questions you have with your health care provider. Document Released: 05/20/2001 Document Revised: 10/17/2016 Document Reviewed: 10/17/2016 Elsevier Interactive Patient Education  2017 [employer] Second Trimester of Pregnancy The second trimester is from week 13 through week 28, month 4 through 6. This is often the time in pregnancy that you feel your best. Often times, morning sickness has lessened or quit. You may have more energy, and you may get hungry more often. Your unborn baby (fetus) is growing rapidly. At the end of the sixth month, he or she is about 9 inches long and weighs about 1 pounds. You will likely feel the baby move (quickening) between 18 and 20 weeks of pregnancy. Follow these instructions at home:  Avoid all smoking, herbs, and alcohol. Avoid drugs not approved by your doctor.  Do not use any tobacco products, including cigarettes, chewing tobacco, and electronic cigarettes. If you need help quitting, ask your doctor. You may get counseling or other support to help you quit.  Only take medicine as told by your doctor.  Some medicines are safe and some are not during pregnancy.  Exercise only as told by your doctor. Stop exercising if you start having cramps.  Eat regular, healthy meals.  Wear a good support bra if your breasts are tender.  Do not use hot tubs, steam rooms, or saunas.  Wear your seat belt when driving.  Avoid raw meat, uncooked cheese, and liter boxes and soil used by cats.  Take your prenatal vitamins.  Take 1500-2000 milligrams of calcium daily starting at the 20th week of pregnancy until you deliver your baby.  Try taking medicine that helps you poop (stool softener) as needed, and if your doctor approves. Eat more fiber by eating fresh fruit, vegetables, and whole grains. Drink enough fluids to keep your pee (urine) clear or pale yellow.  Take warm water baths (sitz baths) to soothe pain or discomfort caused by hemorrhoids. Use hemorrhoid cream if your doctor approves.  If you have puffy, bulging veins (varicose veins), wear support hose. Raise (elevate) your feet for 15 minutes, 3-4 times a day. Limit salt in your diet.  Avoid heavy lifting, wear low heals, and sit up straight.  Rest with your legs raised if you have leg cramps or low back pain.  Visit your dentist if you have not gone during your pregnancy. Use a soft toothbrush to brush your teeth. Be gentle when you floss.  You  can have sex (intercourse) unless your doctor tells you not to.  Go to your doctor visits. Get help if:  You feel dizzy.  You have mild cramps or pressure in your lower belly (abdomen).  You have a nagging pain in your belly area.  You continue to feel sick to your stomach (nauseous), throw up (vomit), or have watery poop (diarrhea).  You have bad smelling fluid coming from your vagina.  You have pain with peeing (urination). Get help right away if:  You have a fever.  You are leaking fluid from your vagina.  You have spotting or bleeding from your vagina.  You have severe belly  cramping or pain.  You lose or gain weight rapidly.  You have trouble catching your breath and have chest pain.  You notice sudden or extreme puffiness (swelling) of your face, hands, ankles, feet, or legs.  You have not felt the baby move in over an hour.  You have severe headaches that do not go away with medicine.  You have vision changes. This information is not intended to replace advice given to you by your health care provider. Make sure you discuss any questions you have with your health care provider. Document Released: 02/18/2010 Document Revised: 05/01/2016 Document Reviewed: 01/25/2013 Elsevier Interactive Patient Education  2017 [employer].

## 2018-05-14 NOTE — Progress Notes (Signed)
Pt states she has had 10 pregnancies that her first baby in 2009 was a female that was murdered when she was 24six months old.  She states she has had 8 c-sections, all her deliveries.  She is having lower back pain that comes and goes.  Denies LOF/VB.  Unsure of last lmp.  States she thought it was after her delivery in July.

## 2018-05-14 NOTE — MAU Note (Signed)
Pt arrives via EMS with complaint of abd pain , ? Contractions. Pt reports she is pregnant but has not seen a doctor. States she thinks she is about [redacted] weeks pregnant.

## 2018-05-15 LAB — HIV ANTIBODY (ROUTINE TESTING W REFLEX): HIV Screen 4th Generation wRfx: NONREACTIVE

## 2018-05-17 LAB — GC/CHLAMYDIA PROBE AMP (~~LOC~~) NOT AT ARMC
Chlamydia: NEGATIVE
NEISSERIA GONORRHEA: NEGATIVE

## 2018-05-23 ENCOUNTER — Inpatient Hospital Stay (HOSPITAL_COMMUNITY)
Admission: AD | Admit: 2018-05-23 | Discharge: 2018-05-23 | Disposition: A | Discharge status: Home / Self Care | Payer: Medicaid Other | Source: Ambulatory Visit | Attending: Obstetrics and Gynecology | Admitting: Obstetrics and Gynecology

## 2018-05-23 ENCOUNTER — Encounter (HOSPITAL_COMMUNITY): Payer: Self-pay | Admitting: *Deleted

## 2018-05-23 DIAGNOSIS — Z887 Allergy status to serum and vaccine status: Secondary | ICD-10-CM | POA: Diagnosis not present

## 2018-05-23 DIAGNOSIS — Z79899 Other long term (current) drug therapy: Secondary | ICD-10-CM | POA: Insufficient documentation

## 2018-05-23 DIAGNOSIS — Z792 Long term (current) use of antibiotics: Secondary | ICD-10-CM | POA: Diagnosis not present

## 2018-05-23 DIAGNOSIS — Z3A20 20 weeks gestation of pregnancy: Secondary | ICD-10-CM | POA: Insufficient documentation

## 2018-05-23 DIAGNOSIS — O26892 Other specified pregnancy related conditions, second trimester: Secondary | ICD-10-CM | POA: Insufficient documentation

## 2018-05-23 DIAGNOSIS — Z9889 Other specified postprocedural states: Secondary | ICD-10-CM | POA: Insufficient documentation

## 2018-05-23 DIAGNOSIS — Z91040 Latex allergy status: Secondary | ICD-10-CM | POA: Diagnosis not present

## 2018-05-23 DIAGNOSIS — R102 Pelvic and perineal pain: Secondary | ICD-10-CM | POA: Insufficient documentation

## 2018-05-23 DIAGNOSIS — R51 Headache: Secondary | ICD-10-CM | POA: Diagnosis not present

## 2018-05-23 DIAGNOSIS — O9989 Other specified diseases and conditions complicating pregnancy, childbirth and the puerperium: Secondary | ICD-10-CM

## 2018-05-23 DIAGNOSIS — R519 Headache, unspecified: Secondary | ICD-10-CM

## 2018-05-23 DIAGNOSIS — N949 Unspecified condition associated with female genital organs and menstrual cycle: Secondary | ICD-10-CM | POA: Diagnosis not present

## 2018-05-23 LAB — URINALYSIS, MICROSCOPIC (REFLEX): Bacteria, UA: NONE SEEN

## 2018-05-23 LAB — URINALYSIS, ROUTINE W REFLEX MICROSCOPIC
Bilirubin Urine: NEGATIVE
Glucose, UA: NEGATIVE mg/dL
Hgb urine dipstick: NEGATIVE
KETONES UR: NEGATIVE mg/dL
NITRITE: NEGATIVE
PROTEIN: NEGATIVE mg/dL
Specific Gravity, Urine: 1.01 (ref 1.005–1.030)
pH: 7 (ref 5.0–8.0)

## 2018-05-23 LAB — RAPID URINE DRUG SCREEN, HOSP PERFORMED
AMPHETAMINES: NOT DETECTED
Benzodiazepines: NOT DETECTED
COCAINE: NOT DETECTED
OPIATES: NOT DETECTED
Tetrahydrocannabinol: NOT DETECTED

## 2018-05-23 MED ORDER — ACETAMINOPHEN 325 MG PO TABS
650.0000 mg | ORAL_TABLET | Freq: Once | ORAL | Status: AC
  Administered 2018-05-23: 650 mg via ORAL
  Filled 2018-05-23: qty 2

## 2018-05-23 NOTE — Discharge Instructions (Signed)
Keep your appointment as planned for tomorrow. Begin prenatal care as soon as possible. No smoking, no drugs, no alcohol.  Do not use cocaine as it can be life threatening in pregnancy. Take a prenatal vitamin one by mouth every day.   Eat small frequent snacks to avoid nausea.   Return with worsening pain or vaginal bleeding.

## 2018-05-23 NOTE — MAU Note (Addendum)
Jasmine Taylor is a 22 y.o. at 3639w0d here in MAU reporting: +contractions +headache Onset of complaint: since 9am  Pain score: contractions 9/10 and headache 8/10 Arrived [facility] ems; [facility] Vitals:   05/23/18 1427  BP: (!) 145/86  Pulse: 98  Resp: 16  Temp: 97.7 F (36.5 C)  SpO2: 99%     FHT: 152 Lab orders placed from triage: ua

## 2018-05-23 NOTE — MAU Provider Note (Addendum)
History     CSN: [id number]  Arrival date and time: 05/23/18 1425   First Provider Initiated Contact with Patient 05/23/18 1446      Chief Complaint  Patient presents with  . Contractions  . Headache   HPI Jasmine Taylor 22 y.o. 2977w0d  Client awakened this morning and had bilateral lower abdominal pain that comes and goes.  She says she is having contractions. Additionally she reports having a headache.  She did drink a couple of glasses of water this morning but the head ache persisted.  Did not taken any medication at home.   Arrived to MAU via EMS.  [facility] but no appointment seen in Epic.  Was seen in MAU on 05-12-18 and note, labs and US reviewed.  Client says she had a UTI but no diagnosis of UTI seen and reviewing previous labs does not show UTI.  Of note, she was diagnosed with trichomonas and she and her partner were prescribed treatment.   OB History    Gravida  9   Para  6   Term  6   Preterm  0   AB  2   Living  5     SAB  2   TAB  0   Ectopic  0   Multiple  2   Live Births  5           Past Medical History:  Diagnosis Date  . ADHD (attention deficit hyperactivity disorder)   . Anemia   . Anxiety   . Depression   . Hepatitis C   . Pregnancy induced hypertension     Past Surgical History:  Procedure Laterality Date  . CESAREAN SECTION    . CESAREAN SECTION N/A 06/28/2017   Procedure: CESAREAN SECTION;  Surgeon: Willodean RosenthalHarraway-Smith, Carolyn, MD;  [facility];  Service: Obstetrics;  Laterality: N/A;    History reviewed. No pertinent family history.  Social History   Tobacco Use  . Smoking status: Current Every Day Smoker    Packs/day: 0.50    Types: Cigarettes  . Smokeless tobacco: Never Used  Substance Use Topics  . Alcohol use: No  . Drug use: No    Allergies:  Allergies  Allergen Reactions  . Influenza A (H1n1) Monovalent Vaccine Swelling  . Latex Itching  and Rash    Medications Prior to Admission  Medication Sig Dispense Refill Last Dose  . acetaminophen (TYLENOL) 500 MG tablet Take 500 mg by mouth every 6 (six) hours as needed for headache.   05/13/2018 at Unknown time  . metroNIDAZOLE (FLAGYL) 500 MG tablet Take all 4 tabs at once with food 4 tablet 0     Review of Systems  Constitutional: Negative for fever.  Gastrointestinal: Positive for abdominal pain. Negative for nausea and vomiting.  Genitourinary: Negative for vaginal bleeding and vaginal discharge.   Physical Exam   Blood pressure 118/68, pulse 97, temperature 97.7 F (36.5 C), temperature source Oral, resp. rate 16, SpO2 99 %, unknown if currently breastfeeding.  Physical Exam  Nursing note and vitals reviewed. Constitutional: She is oriented to person, place, and time. She appears well-developed and well-nourished.  HENT:  Head: Normocephalic.  Eyes: EOM are normal.  Neck: Neck supple.  GI: Soft. There is no tenderness. There is no rebound and no guarding.  No contractions palpated  Genitourinary:  Genitourinary Comments: Cervical exam done - cervix thick and closed.  No presenting part  palpated.  No blood seen.  Musculoskeletal: Normal range of motion.  Neurological: She is alert and oriented to person, place, and time.  Skin: Skin is warm and dry.  Psychiatric: She has a normal mood and affect.    MAU Course  Procedures Results for orders placed or performed during the hospital encounter of 05/23/18 (from the past 24 hour(s))  Urine rapid drug screen (hosp performed)     Status: Abnormal   Collection Time: 05/23/18  2:40 PM  Result Value Ref Range   Opiates NONE DETECTED NONE DETECTED   Cocaine NONE DETECTED NONE DETECTED   Benzodiazepines NONE DETECTED NONE DETECTED   Amphetamines NONE DETECTED NONE DETECTED   Tetrahydrocannabinol NONE DETECTED NONE DETECTED   Barbiturates (A) NONE DETECTED    Result not available. Reagent lot number recalled by  manufacturer.  Urinalysis, Routine w reflex microscopic     Status: Abnormal   Collection Time: 05/23/18  2:42 PM  Result Value Ref Range   Color, Urine YELLOW YELLOW   APPearance CLEAR CLEAR   Specific Gravity, Urine 1.010 1.005 - 1.030   pH 7.0 5.0 - 8.0   Glucose, UA NEGATIVE NEGATIVE mg/dL   Hgb urine dipstick NEGATIVE NEGATIVE   Bilirubin Urine NEGATIVE NEGATIVE   Ketones, ur NEGATIVE NEGATIVE mg/dL   Protein, ur NEGATIVE NEGATIVE mg/dL   Nitrite NEGATIVE NEGATIVE   Leukocytes, UA SMALL (A) NEGATIVE  Urinalysis, Microscopic (reflex)     Status: None   Collection Time: 05/23/18  2:42 PM  Result Value Ref Range   RBC / HPF 0-5 0 - 5 RBC/hpf   WBC, UA 6-10 0 - 5 WBC/hpf   Bacteria, UA NONE SEEN NONE SEEN   Squamous Epithelial / LPF 0-5 0 - 5    MDM No evidence of UTI seen in urinalysis today.  Will give Tylenol in MAU and push PO fluids now.  Given that contractions are not palpated and client is feeling pain in lower quadrants bilaterally, this may be round ligament pain.  BP is normal today.  Client dozing in bed.  Headache now is 2/10 and abdominal pain is 2/10.  Ready for discharge.  Requesting bus passes.  Assessment and Plan  Round ligament pain Headache  Plan Keep your appointment as planned for tomorrow. Begin prenatal care as soon as possible. No smoking, no drugs, no alcohol.  Do not use cocaine as it can be life threatening in pregnancy. Take a prenatal vitamin one by mouth every day.   Eat small frequent snacks to avoid nausea.   Return with worsening pain or vaginal bleeding.  Lateria Alderman L Sade Hollon 05/23/2018, 2:59 PM

## 2018-05-23 NOTE — MAU Note (Signed)
Patient discharged. Discharge instructions reviewed; patient verbalized understanding of discharge plan of care. Alert and oriented to baseline. All belongings sent home with patient. Bus passes given.

## 2018-06-22 ENCOUNTER — Encounter: Payer: Medicaid Other | Admitting: Obstetrics

## 2018-07-03 ENCOUNTER — Inpatient Hospital Stay (HOSPITAL_BASED_OUTPATIENT_CLINIC_OR_DEPARTMENT_OTHER): Payer: Medicaid Other

## 2018-07-03 ENCOUNTER — Other Ambulatory Visit: Payer: Self-pay

## 2018-07-03 ENCOUNTER — Inpatient Hospital Stay (HOSPITAL_COMMUNITY)
Admission: AD | Admit: 2018-07-03 | Discharge: 2018-07-19 | DRG: 786 | Disposition: A | Discharge status: Home / Self Care | Payer: Medicaid Other | Attending: Family Medicine | Admitting: Family Medicine

## 2018-07-03 ENCOUNTER — Encounter (HOSPITAL_COMMUNITY): Payer: Self-pay

## 2018-07-03 DIAGNOSIS — O4102X0 Oligohydramnios, second trimester, not applicable or unspecified: Secondary | ICD-10-CM | POA: Diagnosis present

## 2018-07-03 DIAGNOSIS — F1290 Cannabis use, unspecified, uncomplicated: Secondary | ICD-10-CM | POA: Diagnosis present

## 2018-07-03 DIAGNOSIS — O99334 Smoking (tobacco) complicating childbirth: Secondary | ICD-10-CM | POA: Diagnosis present

## 2018-07-03 DIAGNOSIS — Z3A27 27 weeks gestation of pregnancy: Secondary | ICD-10-CM | POA: Diagnosis not present

## 2018-07-03 DIAGNOSIS — O9842 Viral hepatitis complicating childbirth: Secondary | ICD-10-CM | POA: Diagnosis present

## 2018-07-03 DIAGNOSIS — O42912 Preterm premature rupture of membranes, unspecified as to length of time between rupture and onset of labor, second trimester: Principal | ICD-10-CM | POA: Diagnosis present

## 2018-07-03 DIAGNOSIS — O322XX0 Maternal care for transverse and oblique lie, not applicable or unspecified: Secondary | ICD-10-CM | POA: Diagnosis present

## 2018-07-03 DIAGNOSIS — Z91040 Latex allergy status: Secondary | ICD-10-CM | POA: Diagnosis not present

## 2018-07-03 DIAGNOSIS — O09299 Supervision of pregnancy with other poor reproductive or obstetric history, unspecified trimester: Secondary | ICD-10-CM

## 2018-07-03 DIAGNOSIS — O4292 Full-term premature rupture of membranes, unspecified as to length of time between rupture and onset of labor: Secondary | ICD-10-CM | POA: Diagnosis not present

## 2018-07-03 DIAGNOSIS — O34211 Maternal care for low transverse scar from previous cesarean delivery: Secondary | ICD-10-CM | POA: Diagnosis present

## 2018-07-03 DIAGNOSIS — O42919 Preterm premature rupture of membranes, unspecified as to length of time between rupture and onset of labor, unspecified trimester: Secondary | ICD-10-CM | POA: Diagnosis present

## 2018-07-03 DIAGNOSIS — O98419 Viral hepatitis complicating pregnancy, unspecified trimester: Secondary | ICD-10-CM

## 2018-07-03 DIAGNOSIS — F319 Bipolar disorder, unspecified: Secondary | ICD-10-CM | POA: Diagnosis present

## 2018-07-03 DIAGNOSIS — B182 Chronic viral hepatitis C: Secondary | ICD-10-CM | POA: Diagnosis present

## 2018-07-03 DIAGNOSIS — O42113 Preterm premature rupture of membranes, onset of labor more than 24 hours following rupture, third trimester: Secondary | ICD-10-CM | POA: Diagnosis not present

## 2018-07-03 DIAGNOSIS — Z6791 Unspecified blood type, Rh negative: Secondary | ICD-10-CM

## 2018-07-03 DIAGNOSIS — O99324 Drug use complicating childbirth: Secondary | ICD-10-CM | POA: Diagnosis present

## 2018-07-03 DIAGNOSIS — F17210 Nicotine dependence, cigarettes, uncomplicated: Secondary | ICD-10-CM | POA: Diagnosis present

## 2018-07-03 DIAGNOSIS — O26893 Other specified pregnancy related conditions, third trimester: Secondary | ICD-10-CM | POA: Diagnosis present

## 2018-07-03 DIAGNOSIS — O4290 Premature rupture of membranes, unspecified as to length of time between rupture and onset of labor, unspecified weeks of gestation: Secondary | ICD-10-CM

## 2018-07-03 DIAGNOSIS — O0990 Supervision of high risk pregnancy, unspecified, unspecified trimester: Secondary | ICD-10-CM

## 2018-07-03 DIAGNOSIS — O26899 Other specified pregnancy related conditions, unspecified trimester: Secondary | ICD-10-CM

## 2018-07-03 DIAGNOSIS — O42913 Preterm premature rupture of membranes, unspecified as to length of time between rupture and onset of labor, third trimester: Secondary | ICD-10-CM | POA: Diagnosis not present

## 2018-07-03 DIAGNOSIS — O99344 Other mental disorders complicating childbirth: Secondary | ICD-10-CM | POA: Diagnosis present

## 2018-07-03 DIAGNOSIS — Z8632 Personal history of gestational diabetes: Secondary | ICD-10-CM

## 2018-07-03 DIAGNOSIS — Z3A26 26 weeks gestation of pregnancy: Secondary | ICD-10-CM

## 2018-07-03 DIAGNOSIS — O34219 Maternal care for unspecified type scar from previous cesarean delivery: Secondary | ICD-10-CM | POA: Diagnosis present

## 2018-07-03 DIAGNOSIS — F1911 Other psychoactive substance abuse, in remission: Secondary | ICD-10-CM

## 2018-07-03 HISTORY — DX: Preterm premature rupture of membranes, unspecified as to length of time between rupture and onset of labor, unspecified trimester: O42.919

## 2018-07-03 HISTORY — DX: Bipolar disorder, unspecified: F31.9

## 2018-07-03 LAB — RAPID URINE DRUG SCREEN, HOSP PERFORMED
AMPHETAMINES: NOT DETECTED
BENZODIAZEPINES: NOT DETECTED
Barbiturates: NOT DETECTED
COCAINE: NOT DETECTED
OPIATES: NOT DETECTED
Tetrahydrocannabinol: POSITIVE — AB

## 2018-07-03 LAB — WET PREP, GENITAL
Clue Cells Wet Prep HPF POC: NONE SEEN
Sperm: NONE SEEN
Trich, Wet Prep: NONE SEEN

## 2018-07-03 LAB — DIFFERENTIAL
Basophils Absolute: 0 10*3/uL (ref 0.0–0.1)
Basophils Relative: 0 %
EOS PCT: 0 %
Eosinophils Absolute: 0.1 10*3/uL (ref 0.0–0.7)
LYMPHS ABS: 2.4 10*3/uL (ref 0.7–4.0)
LYMPHS PCT: 17 %
MONO ABS: 0.5 10*3/uL (ref 0.1–1.0)
MONOS PCT: 4 %
NEUTROS PCT: 79 %
Neutro Abs: 11.3 10*3/uL — ABNORMAL HIGH (ref 1.7–7.7)

## 2018-07-03 LAB — CBC
HEMATOCRIT: 36 % (ref 36.0–46.0)
HEMOGLOBIN: 12.7 g/dL (ref 12.0–15.0)
MCH: 31.4 pg (ref 26.0–34.0)
MCHC: 35.3 g/dL (ref 30.0–36.0)
MCV: 89.1 fL (ref 78.0–100.0)
Platelets: 222 10*3/uL (ref 150–400)
RBC: 4.04 MIL/uL (ref 3.87–5.11)
RDW: 14.6 % (ref 11.5–15.5)
WBC: 14.3 10*3/uL — ABNORMAL HIGH (ref 4.0–10.5)

## 2018-07-03 LAB — RAPID HIV SCREEN (HIV 1/2 AB+AG)
HIV 1/2 Antibodies: NONREACTIVE
HIV-1 P24 Antigen - HIV24: NONREACTIVE

## 2018-07-03 LAB — GROUP B STREP BY PCR: Group B strep by PCR: NEGATIVE

## 2018-07-03 MED ORDER — ACETAMINOPHEN 325 MG PO TABS
650.0000 mg | ORAL_TABLET | ORAL | Status: DC | PRN
  Administered 2018-07-09 – 2018-07-13 (×2): 650 mg via ORAL
  Filled 2018-07-03 (×2): qty 2

## 2018-07-03 MED ORDER — SODIUM CHLORIDE 0.9 % IV SOLN
5.0000 10*6.[IU] | Freq: Once | INTRAVENOUS | Status: DC
  Filled 2018-07-03: qty 5

## 2018-07-03 MED ORDER — PENICILLIN G POT IN DEXTROSE 60000 UNIT/ML IV SOLN
3.0000 10*6.[IU] | INTRAVENOUS | Status: DC
  Filled 2018-07-03 (×2): qty 50

## 2018-07-03 MED ORDER — MAGNESIUM SULFATE 40 G IN LACTATED RINGERS - SIMPLE
2.0000 g/h | INTRAVENOUS | Status: DC
  Filled 2018-07-03 (×2): qty 500

## 2018-07-03 MED ORDER — LACTATED RINGERS IV BOLUS
500.0000 mL | Freq: Once | INTRAVENOUS | Status: AC
  Administered 2018-07-03: 500 mL via INTRAVENOUS

## 2018-07-03 MED ORDER — AZITHROMYCIN 250 MG PO TABS
1000.0000 mg | ORAL_TABLET | Freq: Once | ORAL | Status: AC
  Administered 2018-07-03: 1000 mg via ORAL
  Filled 2018-07-03: qty 4

## 2018-07-03 MED ORDER — AMOXICILLIN 500 MG PO CAPS
500.0000 mg | ORAL_CAPSULE | Freq: Three times a day (TID) | ORAL | Status: AC
  Administered 2018-07-05 – 2018-07-10 (×15): 500 mg via ORAL
  Filled 2018-07-03 (×15): qty 1

## 2018-07-03 MED ORDER — LACTATED RINGERS IV SOLN
INTRAVENOUS | Status: DC
  Administered 2018-07-03 – 2018-07-04 (×3): via INTRAVENOUS

## 2018-07-03 MED ORDER — MAGNESIUM SULFATE 40 G IN LACTATED RINGERS - SIMPLE
2.0000 g/h | INTRAVENOUS | Status: AC
  Administered 2018-07-03 – 2018-07-04 (×2): 2 g/h via INTRAVENOUS
  Filled 2018-07-03 (×2): qty 40

## 2018-07-03 MED ORDER — AMPICILLIN SODIUM 2 G IJ SOLR
2.0000 g | Freq: Four times a day (QID) | INTRAMUSCULAR | Status: AC
  Administered 2018-07-03 – 2018-07-05 (×8): 2 g via INTRAVENOUS
  Filled 2018-07-03 (×8): qty 2

## 2018-07-03 MED ORDER — MAGNESIUM SULFATE BOLUS VIA INFUSION
4.0000 g | Freq: Once | INTRAVENOUS | Status: AC
  Administered 2018-07-03: 4 g via INTRAVENOUS
  Filled 2018-07-03: qty 500

## 2018-07-03 MED ORDER — PRENATAL MULTIVITAMIN CH
1.0000 | ORAL_TABLET | Freq: Every day | ORAL | Status: DC
  Administered 2018-07-04 – 2018-07-14 (×11): 1 via ORAL
  Filled 2018-07-03 (×11): qty 1

## 2018-07-03 MED ORDER — ZOLPIDEM TARTRATE 5 MG PO TABS: 5.0000 mg | ORAL_TABLET | Freq: Every evening | ORAL | Status: DC | PRN

## 2018-07-03 MED ORDER — MAGNESIUM SULFATE 4 GM/100ML IV SOLN
4.0000 g | Freq: Once | INTRAVENOUS | Status: DC
  Filled 2018-07-03: qty 100

## 2018-07-03 MED ORDER — MAGNESIUM SULFATE BOLUS VIA INFUSION
4.0000 g | Freq: Once | INTRAVENOUS | Status: DC
  Filled 2018-07-03: qty 500

## 2018-07-03 MED ORDER — DOCUSATE SODIUM 100 MG PO CAPS
100.0000 mg | ORAL_CAPSULE | Freq: Every day | ORAL | Status: DC
  Administered 2018-07-05 – 2018-07-14 (×10): 100 mg via ORAL
  Filled 2018-07-03 (×10): qty 1

## 2018-07-03 MED ORDER — BETAMETHASONE SOD PHOS & ACET 6 (3-3) MG/ML IJ SUSP
12.0000 mg | INTRAMUSCULAR | Status: AC
  Administered 2018-07-03 – 2018-07-04 (×2): 12 mg via INTRAMUSCULAR
  Filled 2018-07-03 (×2): qty 2

## 2018-07-03 MED ORDER — MAGNESIUM SULFATE 40 G IN LACTATED RINGERS - SIMPLE
2.0000 g/h | INTRAVENOUS | Status: DC
  Filled 2018-07-03: qty 500

## 2018-07-03 MED ORDER — CALCIUM CARBONATE ANTACID 500 MG PO CHEW: 2.0000 | CHEWABLE_TABLET | ORAL | Status: DC | PRN

## 2018-07-03 MED ORDER — PENICILLIN G POTASSIUM 5000000 UNITS IJ SOLR
2.5000 10*6.[IU] | INTRAVENOUS | Status: DC
  Filled 2018-07-03 (×2): qty 2.5

## 2018-07-03 NOTE — MAU Note (Signed)
Pt arrived on EMS, reports SROM around 1630, clear fluid and ctx. Has not gotten any PNC. Pt unsure about LMP.

## 2018-07-03 NOTE — H&P (Signed)
ANTEPARTUM ADMISSION HISTORY AND PHYSICAL NOTE   History of Present Illness: Jasmine Taylor is a 22 y.o. [id number] at 538w0d by 18-wk U/S admitted for PPROM. She reports LOF with large amount of clear fluid today around 16:30. She had not yet established prenatal care (had first PNV scheduled for 7/30). Patient reports the fetal movement as active. Patient reports uterine contraction  activity as irregular contractions since ROM. Patient reports  vaginal bleeding as none. Patient describes fluid per vagina as Clear. Fetal presentation is cephalic by formal U/S today.  Patient Active Problem List   Diagnosis Date Noted  . Preterm premature rupture of membranes (PPROM) with unknown onset of labor 07/03/2018  . Marijuana use 07/03/2018  . History of gestational diabetes in prior pregnancy, currently pregnant 05/07/2017  . 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 Hx of drug abuse 03/20/2017  . Rh negative, antepartum 02/12/2017  . Supervision of high risk pregnancy, antepartum 01/14/2017  . Hx of preeclampsia, prior pregnancy, currently pregnant 01/14/2017  . Previous cesarean delivery, antepartum 01/14/2017  . Bipolar disorder (HCC) 01/14/2017    Past Medical History:  Diagnosis Date  . ADHD (attention deficit hyperactivity disorder)   . Anemia   . Anxiety   . Bipolar disorder (HCC)   . Depression   . Hepatitis C   . Pregnancy induced hypertension     Past Surgical History:  Procedure Laterality Date  . CESAREAN SECTION    . CESAREAN SECTION N/A 06/28/2017   Procedure: CESAREAN SECTION;  Surgeon: Willodean RosenthalHarraway-Smith, Carolyn, MD;  [facility];  Service: Obstetrics;  Laterality: N/A;    OB History  Gravida Para Term Preterm AB Living  8 2 2  0 5 2  SAB TAB Ectopic Multiple Live Births  5 0 0 2 2    # Outcome Date GA Lbr Len/2nd Weight Sex Delivery Anes PTL Lv  8 Current           7 Term 06/28/17 2025w1d  5 lb 10.1 oz (2.555  kg) M CS-LTranv EPI  LIV  6 SAB 2012     SAB     5 Term 03/19/10 1865w0d  5 lb 8 oz (2.495 kg) M CS-LTranv   LIV     Complications: Failure to Progress in Second Stage, Preeclampsia  4 SAB 2011          3 SAB           2 SAB           1 SAB             Social History   Socioeconomic History  . Marital status: Married    Spouse name: Not on file  . Number of children: Not on file  . Years of education: Not on file  . Highest education level: Not on file  Occupational History  . Not on file  Social Needs  . Financial resource strain: Not on file  . Food insecurity:    Worry: Not on file    Inability: Not on file  . Transportation needs:    Medical: Not on file    Non-medical: Not on file  Tobacco Use  . Smoking status: Current Every Day Smoker    Packs/day: 0.50    Types: Cigarettes  . Smokeless tobacco: Never Used  Substance and Sexual Activity  . Alcohol use: No  . Drug use: Yes    Types: Marijuana    Comment: last time was  7/26  . Sexual activity: Yes    Birth control/protection: None  Lifestyle  . Physical activity:    Days per week: Not on file    Minutes per session: Not on file  . Stress: Not on file  Relationships  . Social connections:    Talks on phone: Not on file    Gets together: Not on file    Attends religious service: Not on file    Active member of club or organization: Not on file    Attends meetings of clubs or organizations: Not on file    Relationship status: Not on file  Other Topics Concern  . Not on file  Social History Narrative  . Not on file    History reviewed. No pertinent family history.  Allergies  Allergen Reactions  . Influenza A (H1n1) Monovalent Vaccine Swelling  . Latex Itching and Rash    Medications Prior to Admission  Medication Sig Dispense Refill Last Dose  . acetaminophen (TYLENOL) 500 MG tablet Take 500 mg by mouth every 6 (six) hours as needed for headache.   05/13/2018 at Unknown time  . metroNIDAZOLE  (FLAGYL) 500 MG tablet Take all 4 tabs at once with food 4 tablet 0     Review of Systems - Negative except for as noted on HPI  Vitals:  BP 128/83 (BP Location: Left Arm)   Pulse 97   Temp 98.3 F (36.8 C) (Oral)   Resp 18   LMP  (LMP Unknown)  Physical Examination: CONSTITUTIONAL: Well-developed, well-nourished female in no acute distress.  HENT:  Normocephalic, atraumatic, External right and left ear normal. Oropharynx is clear and moist EYES: Conjunctivae and EOM are normal. Pupils are equal, round. No scleral icterus.  NECK: Normal range of motion SKIN: Skin is warm and dry. No rash noted. Not diaphoretic. No erythema. No pallor. NEUROLGIC: Alert and oriented to person, place, and time. Normal muscle tone coordination. No cranial nerve deficit noted. PSYCHIATRIC: Normal mood and affect. Normal behavior. Normal judgment and thought content. CARDIOVASCULAR: Normal heart rate noted RESPIRATORY: Effort and breath sounds normal, no problems with respiration noted ABDOMEN: Soft, nontender, nondistended, gravid. MUSCULOSKELETAL: Normal range of motion. No edema  Cervix: Evaluated by sterile speculum exam. and Evaluated by digital exam. and found to be 1 cm/ Long/-3 and fetal presentation is cephalic. Membranes:ruptured, clear fluid Fetal Monitoring:Baseline: 125 bpm, moderate variability, +acel, no decel Tocometer: uterine irritability  Labs:  Results for orders placed or performed during the hospital encounter of 07/03/18 (from the past 24 hour(s))  Wet prep, genital   Collection Time: 07/03/18  5:32 PM  Result Value Ref Range   Yeast Wet Prep HPF POC PRESENT (A) NONE SEEN   Trich, Wet Prep NONE SEEN NONE SEEN   Clue Cells Wet Prep HPF POC NONE SEEN NONE SEEN   WBC, Wet Prep HPF POC MODERATE (A) NONE SEEN   Sperm NONE SEEN     Ultrasound today (preliminary report): GA by today's ultrasound [redacted]w[redacted]d; best GA [redacted]w[redacted]d EFW 790 grams (1lb 12 oz, 38%ile) AFI  0.91 Cephalic  Assessment and Plan: Patient Active Problem List   Diagnosis Date Noted  . Preterm premature rupture of membranes (PPROM) with unknown onset of labor 07/03/2018  . Marijuana use 07/03/2018  . History of gestational diabetes in prior pregnancy, currently pregnant 05/07/2017  . 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 Hx of drug abuse 03/20/2017  . Rh negative, antepartum 02/12/2017  . Supervision of high  risk pregnancy, antepartum 01/14/2017  . Hx of preeclampsia, prior pregnancy, currently pregnant 01/14/2017  . Previous cesarean delivery, antepartum 01/14/2017  . Bipolar disorder (HCC) 01/14/2017   21 y.o. Z6X0960 with IUP at [redacted]w[redacted]d here for PPROM. --Admit to Antenatal --Magnesium sulfate --BMZ q24 x 2 --Latency antibiotics (Amp x 48 hours followed by amoxicillin x 5 days; azithromycin 1 g once) --Prenatal labs ordered, including GBS culture --Check HCV ab (h/o HCV note in EHR) --UDS --NICU consult --SW consult --Routine antenatal care  Frederik Pear, MD OB Fellow Faculty Practice, [facility]

## 2018-07-04 LAB — HEPATITIS B SURFACE ANTIGEN: Hepatitis B Surface Ag: NEGATIVE

## 2018-07-04 LAB — RPR: RPR: NONREACTIVE

## 2018-07-04 LAB — RUBELLA SCREEN: Rubella: 1.38 index (ref >0.99)

## 2018-07-04 NOTE — Consult Note (Signed)
Neonatology Consult to Antenatal Patient:  I was asked by Dr. Debroah LoopArnold to see this patient in order to provide antenatal counseling due to premature ROM.  Ms. Jasmine Taylor was admitted last evening at 26 0/[redacted] weeks GA (by 18 weeks ultrasound). She is currently not having active labor. She is getting BMZ does #2 this evening, is on Magnesium sulfate, and IV Ampicillin/Azithromycin. She is afebrile. She has had 2 5 pound infants who were healthy in the past. This infant is thought to be female (75% certain, per mother). Mother has bipolar disorder, chronic Hep C, is a smoker, and has a positive UDS for marijuana.  I spoke with the patient and the father of the baby. We discussed the worst case of delivery in the next 1-2 days, including usual DR management, possible respiratory complications and need for support, IV access, feedings, LOS, Mortality and Morbidity, and long term outcomes. They did not have any questions at this time. I offered a NICU tour to the father and would be glad to come back if either parent has more questions later.  Thank you for asking me to see this patient.  Doretha Souhristie C. Ashlon Lottman, MD Neonatologist  The total length of face-to-face or floor/unit time for this encounter was 25 minutes. Counseling and/or coordination of care was 20 minutes of the above.

## 2018-07-04 NOTE — Progress Notes (Signed)
Patient ID: Jasmine Taylor, female   DOB: [birth year], 22 y.o.   MRN: [id number] FACULTY PRACTICE ANTEPARTUM(COMPREHENSIVE) NOTE  Jasmine Taylor is a 22 y.o. [id number] at [redacted]w[redacted]d by best clinical estimate who is admitted for PROM.   Fetal presentation is cephalic. Length of Stay:  1  Days  Subjective: No pain Patient reports the fetal movement as active. Patient reports uterine contraction  activity as none. Patient reports  vaginal bleeding as none. Patient describes fluid per vagina as Clear.  Vitals:  Blood pressure 112/72, pulse 78, temperature 97.6 F (36.4 C), temperature source Oral, resp. rate 16, SpO2 99 %, unknown if currently breastfeeding. Physical Examination:  General appearance - alert, well appearing, and in no distress Heart - normal rate and regular rhythm Abdomen - soft, nontender, nondistended Fundal Height:  size equals dates Cervical Exam: Not evaluated. Extremities: extremities normal, atraumatic, no cyanosis or edema and Homans sign is negative, no sign of DVT Membranes:ruptured  Fetal Monitoring:     Fetal Heart Rate A  Mode External filed at 07/04/2018 0700  Baseline Rate (A) 125 bpm filed at 07/04/2018 0700  Variability 6-25 BPM filed at 07/04/2018 0700  Accelerations 10 x 10 filed at 07/04/2018 0700  Decelerations Variable filed at 07/04/2018 0700     Labs:  Results for orders placed or performed during the hospital encounter of 07/03/18 (from the past 24 hour(s))  Urine rapid drug screen (hosp performed)   Collection Time: 07/03/18  5:25 PM  Result Value Ref Range   Opiates NONE DETECTED NONE DETECTED   Cocaine NONE DETECTED NONE DETECTED   Benzodiazepines NONE DETECTED NONE DETECTED   Amphetamines NONE DETECTED NONE DETECTED   Tetrahydrocannabinol POSITIVE (A) NONE DETECTED   Barbiturates NONE DETECTED NONE DETECTED  Group B strep by PCR   Collection Time: 07/03/18  5:32 PM  Result Value Ref Range   Group B strep by PCR NEGATIVE  NEGATIVE  Wet prep, genital   Collection Time: 07/03/18  5:32 PM  Result Value Ref Range   Yeast Wet Prep HPF POC PRESENT (A) NONE SEEN   Trich, Wet Prep NONE SEEN NONE SEEN   Clue Cells Wet Prep HPF POC NONE SEEN NONE SEEN   WBC, Wet Prep HPF POC MODERATE (A) NONE SEEN   Sperm NONE SEEN   Hepatitis B surface antigen   Collection Time: 07/03/18  6:40 PM  Result Value Ref Range   Hepatitis B Surface Ag Negative Negative  RPR   Collection Time: 07/03/18  6:40 PM  Result Value Ref Range   RPR Ser Ql Non Reactive Non Reactive  CBC   Collection Time: 07/03/18  6:40 PM  Result Value Ref Range   WBC 14.3 (H) 4.0 - 10.5 K/uL   RBC 4.04 3.87 - 5.11 MIL/uL   Hemoglobin 12.7 12.0 - 15.0 g/dL   HCT 03.4 74.2 - 59.5 %   MCV 89.1 78.0 - 100.0 fL   MCH 31.4 26.0 - 34.0 pg   MCHC 35.3 30.0 - 36.0 g/dL   RDW 63.8 75.6 - 43.3 %   Platelets 222 150 - 400 K/uL  Differential   Collection Time: 07/03/18  6:40 PM  Result Value Ref Range   Neutrophils Relative % 79 %   Neutro Abs 11.3 (H) 1.7 - 7.7 K/uL   Lymphocytes Relative 17 %   Lymphs Abs 2.4 0.7 - 4.0 K/uL   Monocytes Relative 4 %   Monocytes Absolute 0.5 0.1 - 1.0 K/uL   Eosinophils  Relative 0 %   Eosinophils Absolute 0.1 0.0 - 0.7 K/uL   Basophils Relative 0 %   Basophils Absolute 0.0 0.0 - 0.1 K/uL  Rapid HIV screen (HIV 1/2 Ab+Ag)   Collection Time: 07/03/18  6:40 PM  Result Value Ref Range   HIV-1 P24 Antigen - HIV24 NON REACTIVE NON REACTIVE   HIV 1/2 Antibodies NON REACTIVE NON REACTIVE   Interpretation (HIV Ag Ab)      A non reactive test result means that HIV 1 or HIV 2 antibodies and HIV 1 p24 antigen were not detected in the specimen.  Type and screen [facility]   Collection Time: 07/03/18  6:40 PM  Result Value Ref Range   ABO/RH(D) A NEG    Antibody Screen NEG    Sample Expiration      07/06/2018 Performed at [facility], [address] [place], [place] [id number]       Medications:   Scheduled . [START ON 07/05/2018] amoxicillin  500 mg Oral Q8H  . betamethasone acetate-betamethasone sodium phosphate  12 mg Intramuscular Q24 Hr x 2  . docusate sodium  100 mg Oral Daily  . prenatal multivitamin  1 tablet Oral Q1200   I have reviewed the patient's current medications.  ASSESSMENT: Patient Active Problem List   Diagnosis Date Noted  . Preterm premature rupture of membranes (PPROM) with unknown onset of labor 07/03/2018  . Marijuana use 07/03/2018  . History of gestational diabetes in prior pregnancy, currently pregnant 05/07/2017  . 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 Hx of drug abuse 03/20/2017  . Rh negative, antepartum 02/12/2017  . Supervision of high risk pregnancy, antepartum 01/14/2017  . Hx of preeclampsia, prior pregnancy, currently pregnant 01/14/2017  . Previous cesarean delivery, antepartum 01/14/2017  . Bipolar disorder (HCC) 01/14/2017    PLAN: Continue present management for PPROM  Scheryl DarterJames Arnold 07/04/2018,10:39 AM

## 2018-07-05 ENCOUNTER — Encounter (HOSPITAL_COMMUNITY): Payer: Self-pay

## 2018-07-05 LAB — CULTURE, BETA STREP (GROUP B ONLY)

## 2018-07-05 LAB — GC/CHLAMYDIA PROBE AMP (~~LOC~~) NOT AT ARMC
Chlamydia: NEGATIVE
NEISSERIA GONORRHEA: NEGATIVE

## 2018-07-05 LAB — HEPATITIS C ANTIBODY (REFLEX)

## 2018-07-05 LAB — COMMENT2 - HEP PANEL

## 2018-07-05 MED ORDER — LACTATED RINGERS IV SOLN: INTRAVENOUS | Status: DC

## 2018-07-05 MED ORDER — SODIUM CHLORIDE 0.9 % IV SOLN
INTRAVENOUS | Status: DC
  Administered 2018-07-05 (×2): via INTRAVENOUS

## 2018-07-05 NOTE — Progress Notes (Signed)
CSW attempted to meet with patient to offer support and complete assessment due to [facility] and [facility] use, but she was sleeping at this time.  CSW recalls meeting patient 1 year ago with her last delivery and notes that she also has mental health concerns-Bipolar/Anxiety, hx of CPS involvement and unstable housing.   Patient woke up enough to acknowledge CSW's presence and CSW briefly spoke with patient to encourage her to rest and inform her that CSW will return at a later time.  CSW also informed her that CSW works in the NICU and acknowledged that MOB will be delivering prematurely.  She nodded in agreement.  CSW will return at a later time, but also provided patient with CSW's contact information if she would like to call when it is a good time for her to talk with CSW.

## 2018-07-05 NOTE — Progress Notes (Signed)
Patient ID: Christin FudgeAutumn Nicole Escudero, female   DOB: 1996-01-25, 22 y.o.   MRN: [id number] FACULTY PRACTICE ANTEPARTUM(COMPREHENSIVE) NOTE  Tene Gae Bonicole Si is a 22 y.o. [id number] at 952w2d by midtrimester ultrasound who is admitted for PROM.   Fetal presentation is cephalic. Length of Stay:  2  Days  Subjective:  Patient reports the fetal movement as active. Patient reports uterine contraction  activity as none. Patient reports  vaginal bleeding as none. Patient describes fluid per vagina as Clear.  Vitals:  Blood pressure 113/79, pulse 96, temperature 98.3 F (36.8 C), temperature source Oral, resp. rate 18, SpO2 100 %, unknown if currently breastfeeding. Physical Examination:  General appearance - alert, well appearing, and in no distress Heart - normal rate and regular rhythm Abdomen - soft, nontender, nondistended Fundal Height:  size equals dates Cervical Exam: Not evaluated.  Extremities: extremities normal, atraumatic, no cyanosis or edema and Homans sign is negative, no sign of DVT  Membranes:ruptured  Fetal Monitoring:   Fetal Heart Rate A  Mode External filed at 07/05/2018 0444  Baseline Rate (A) 140 bpm filed at 07/05/2018 0444  Variability 6-25 BPM filed at 07/05/2018 0444  Accelerations 15 x 15 filed at 07/05/2018 0444  Decelerations Variable  [462mn variable,FHRdown to 120, resolved without intervention] filed at 07/05/2018 0444         Labs:  No results found for this or any previous visit (from the past 24 hour(s)).  Imaging Studies:      Medications:  Scheduled . amoxicillin  500 mg Oral Q8H  . docusate sodium  100 mg Oral Daily  . prenatal multivitamin  1 tablet Oral Q1200   I have reviewed the patient's current medications.  ASSESSMENT: Patient Active Problem List   Diagnosis Date Noted  . Preterm premature rupture of membranes (PPROM) with unknown onset of labor 07/03/2018  . Marijuana use 07/03/2018  . History of gestational diabetes in prior  pregnancy, currently pregnant 05/07/2017  . 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 Hx of drug abuse 03/20/2017  . Rh negative, antepartum 02/12/2017  . Supervision of high risk pregnancy, antepartum 01/14/2017  . Hx of preeclampsia, prior pregnancy, currently pregnant 01/14/2017  . Previous cesarean delivery, antepartum 01/14/2017  . Bipolar disorder (HCC) 01/14/2017    PLAN: Latency antibiotics, observe for PTL, infection  Scheryl DarterJames Jerard Bays 07/05/2018,7:59 AM

## 2018-07-06 ENCOUNTER — Encounter: Payer: Medicaid Other | Admitting: Obstetrics

## 2018-07-06 LAB — TYPE AND SCREEN
ABO/RH(D): A NEG
ABO/RH(D): A NEG
ANTIBODY SCREEN: NEGATIVE
Antibody Screen: NEGATIVE

## 2018-07-06 MED ORDER — SODIUM CHLORIDE 0.9% FLUSH
3.0000 mL | Freq: Two times a day (BID) | INTRAVENOUS | Status: DC
  Administered 2018-07-06 – 2018-07-10 (×8): 3 mL via INTRAVENOUS

## 2018-07-06 MED ORDER — RHO D IMMUNE GLOBULIN 1500 UNIT/2ML IJ SOSY
300.0000 ug | PREFILLED_SYRINGE | Freq: Once | INTRAMUSCULAR | Status: AC
Start: 2018-07-06 — End: 2018-07-06
  Administered 2018-07-06: 300 ug via INTRAVENOUS
  Filled 2018-07-06: qty 2

## 2018-07-06 NOTE — Progress Notes (Signed)
Patient ID: Jasmine FudgeAutumn Nicole Taylor, female   DOB: [birth year], 22 y.o.   MRN: [id number]  FACULTY PRACTICE ANTEPARTUM NOTE  Jasmine Taylor is a 22 y.o. [id number] at 1124w3d  who is admitted for rupture of membranes.   Fetal presentation is cephalic. Length of Stay:  3  Days  Subjective: Feels well, no complaints. Patient reports good fetal movement.   She reports no uterine contractions She reports no bleeding  She reports no loss of fluid per vagina.  Vitals:  Blood pressure 110/78, pulse 82, temperature 99.2 F (37.3 C), temperature source Oral, resp. rate 18, SpO2 100 %, unknown if currently breastfeeding. Physical Examination:  General appearance - alert, well appearing, and in no distress Chest - clear to auscultation, no wheezes, rales or rhonchi, symmetric air entry Heart - normal rate, regular rhythm, normal S1, S2, no murmurs, rubs, clicks or gallops Abdomen - soft, nontender, nondistended, no masses or organomegaly Fundal Height:  size equals dates Extremities: extremities normal, atraumatic, no cyanosis or edema and Homans sign is negative, no sign of DVT  Membranes:ruptured, clear fluid  Fetal Monitoring: NST x2 reviewed: Baseline: 140s bpm, Variability: Good {> 6 bpm), Accelerations: Reactive and Decelerations: Absent  Labs:  No results found for this or any previous visit (from the past 24 hour(s)).  Imaging Studies:       Medications:  Scheduled . amoxicillin  500 mg Oral Q8H  . docusate sodium  100 mg Oral Daily  . prenatal multivitamin  1 tablet Oral Q1200   I have reviewed the patient's current medications.  ASSESSMENT: Principal Problem:   Preterm premature rupture of membranes (PPROM) with unknown onset of labor Active Problems:   Hx of preeclampsia, prior pregnancy, currently pregnant   Previous cesarean delivery, antepartum   Rh negative, antepartum   Hx of drug abuse   History of gestational diabetes in prior pregnancy, currently pregnant  Marijuana use   PLAN: 1. PPROM  Status post betamethasone 7/27 and 7/28  Oral latency antibiotics x5 days  Last growth ultrasound on 7/28  NST reactive - 2 strips reviewed  2. H/o c/s x2  Possible vaginal delivery if begins to labor  3. [redacted] week gestation  Will need 2-hour GTT next week  (approximately 1 or more weeks after betamethasone administration)  4. Rh neg  Rhogam  5. H/o GDM  Continue routine antenatal care.   Levie HeritageStinson, Jasmine Darcey J, DO 07/06/2018,9:19 AM

## 2018-07-06 NOTE — Progress Notes (Signed)
CSW met with patient in her third floor/303 to offer support and complete assessment for marijuana use.  CSW is familiar with this patient from her delivery last year and notes that patient has a hx of polysubstance use, unstable housing, and mental health concerns including Bipolar and Anxiety.  Patient was pleasant and welcoming of CSW's visit. Patient states she is doing well overall.  Patient reports that she was at her sister's apartment playing with her 35 year old when her water broke.  She states this is her first pregnancy with this complication.  She has an 22 year old who lives with her mother in [place] and a 80 year old who lives with her and her "husband" (patient explained to [place] today that they are not legally married, but have been together for 5 years).  Patient states her sister/[name] is caring for their 67 year old while she is in the hospital.  Patient and her sister both live at the [facility] apartment complex, which patient states is part of [name].  Patient states they each have their own apartment.  FOB lives with patient and their 26 year old son [name].  Patient was a part of this program when CSW met her last summer as well.  She states FOB is now working as a Social worker and she is hopeful that they will be able to get their own place outside of the program soon.  She states they both aspire to go back to school as well.  She would like to either get her high school diploma or GED and go to school for nursing, and FOB wants to go to school for Engineering.   MOB admits to smoking marijuana and cigarettes, and denies all other substances.  She admits to cocaine use in the past, with no use in the last 2 years.  She is understanding of hospital drug screen policy and mandated reporting, as her 22 year old was born positive for marijuana and CPS was involved.  She states no CPS involvement since his birth.  She reports no further need for substance use treatment.   CSW  inquired about her mental health symptoms and needs.  She reports she resumed her medication (Buspar, Adderall and Latuda) after [name] was born, but stopped again when she got pregnant again.  She feels like as long as "I know what's going on (regarding the pregnancy), I'm ok."  She denies need for counseling at this time and states she plans to resume medication again after delivery.  CSW discussed skills for coping with anxiety, as a complicated pregnancy and long term hospitalization can be anxiety provoking.  Patient seemed engaged and appreciative.  CSW asked if patient if she would like a tour of the NICU and if she had any questions at this time.  She did not have questions as of now, but very much wanted to see the NICU.  CSW took her in a wheelchair through the NICU, for which she stated great appreciation.  CSW asked patient to call for support/to process her feelings at any time and will check in with patient periodically otherwise.

## 2018-07-07 DIAGNOSIS — Z3A26 26 weeks gestation of pregnancy: Secondary | ICD-10-CM

## 2018-07-07 LAB — RH IG WORKUP (INCLUDES ABO/RH)
ABO/RH(D): A NEG
FETAL SCREEN: NEGATIVE
Gestational Age(Wks): 26
Unit division: 0

## 2018-07-07 NOTE — Plan of Care (Signed)
  Problem: Skin Integrity: Goal: Risk for impaired skin integrity will decrease Outcome: Progressing   

## 2018-07-07 NOTE — Progress Notes (Signed)
Patient ID: Christin FudgeAutumn Nicole Vankirk, female   DOB: October 10, 1996, 22 y.o.   MRN: [id number]  FACULTY PRACTICE ANTEPARTUM NOTE  Imojean Gae Bonicole Cassetta is a 22 y.o. [id number] at 6240w4d  who is admitted for PPROM.   Fetal presentation is cephalic. Length of Stay:  4  Days  Subjective: Patient doing well. No concerns. Denies abdominal pain, fever, contractions, bleeding, nausea. Patient reports good fetal movement.   She reports no uterine contractions She reports no bleeding  She reports minimal clear fluid per vagina.  Vitals:  Blood pressure 114/68, pulse 79, temperature 97.9 F (36.6 C), temperature source Oral, resp. rate 18, height 4\' 11"  (1.499 m), weight 150 lb (68 kg), SpO2 100 %, unknown if currently breastfeeding. Physical Examination:  General appearance - alert, well appearing, and in no distress and oriented to person, place, and time Fundal Height:  size equals dates Extremities: extremities normal, atraumatic, no cyanosis or edema  Membranes:ruptured  Fetal Monitoring:  Baseline: 140s-150s bpm, Variability: Good {> 6 bpm), Accelerations: Reactive and Decelerations: Absent  Labs:  No results found for this or any previous visit (from the past 24 hour(s)).  Imaging Studies:      Medications:  Scheduled . amoxicillin  500 mg Oral Q8H  . docusate sodium  100 mg Oral Daily  . prenatal multivitamin  1 tablet Oral Q1200  . sodium chloride flush  3 mL Intravenous Q12H   I have reviewed the patient's current medications.  ASSESSMENT: Principal Problem:   Preterm premature rupture of membranes (PPROM) with unknown onset of labor Active Problems:   Hx of preeclampsia, prior pregnancy, currently pregnant   Previous cesarean delivery, antepartum   Rh negative, antepartum   Pregnancy complicated by chronic hepatitis C, antepartum (HCC)   Hx of drug abuse   History of gestational diabetes in prior pregnancy, currently pregnant   Marijuana use   PLAN: 1. PPROM  Status post  betamethasone 7/27 and 7/28  Oral latency antibiotics x5 days  Last growth ultrasound on 7/28  NST reactive - 1 strips reviewed  Continue NSTs every shift  2. H/o c/s x2  Possible vaginal delivery if begins to labor  3. [redacted] week gestation  Will need 2-hour GTT next week  (approximately 1 or more weeks after betamethasone administration)  4. Rh neg  Rhogam  5. H/o GDM   Continue routine antenatal care.   Levie HeritageStinson, Kayslee Furey J, DO 07/07/2018,10:09 AM

## 2018-07-07 NOTE — Progress Notes (Signed)
Pt refused EFM at this time

## 2018-07-08 MED ORDER — AZITHROMYCIN 250 MG PO TABS
1000.0000 mg | ORAL_TABLET | Freq: Once | ORAL | Status: AC
  Administered 2018-07-08: 1000 mg via ORAL
  Filled 2018-07-08: qty 4

## 2018-07-08 NOTE — Progress Notes (Addendum)
Patient ID: [name], female   DOB: 08-15-1996, 22 y.o.   MRN: [id number]  FACULTY PRACTICE ANTEPARTUM NOTE  Jasmine Taylor is a 22 y.o. [id number] at 362w4d  who is admitted for PPROM.   Fetal presentation is cephalic. Length of Stay:  5  Days  Subjective: Patient doing well and is without complaint. No abdominal pain, fever, chills, nausea, vomiting. Patient reports good fetal movement.   She reports no uterine contractions She reports no bleeding  She reports minimal clear fluid per vagina.  Vitals:  Blood pressure (!) 98/57, pulse 87, temperature 98.7 F (37.1 C), temperature source Oral, resp. rate 18, height 4\' 11"  (1.499 m), weight 150 lb (68 kg), SpO2 98 %, unknown if currently breastfeeding. Physical Examination:  General appearance - alert, well appearing, and in no distress and oriented to person, place, and time Heart: RR, NL S1/S2 Lungs: CTA bilaterally Abdomen:No tenderness Fundal Height:  size equals dates Extremities: extremities normal, atraumatic, no cyanosis or edema  Membranes:ruptured  Fetal Monitoring: x2  Baseline: 140s-150s bpm, Variability: Good {> 6 bpm), Accelerations: Non-reactive but appropriate for gestational age and Decelerations: Absent  Labs:  No results found for this or any previous visit (from the past 24 hour(s)).  Imaging Studies:      Medications:  Scheduled . amoxicillin  500 mg Oral Q8H  . docusate sodium  100 mg Oral Daily  . prenatal multivitamin  1 tablet Oral Q1200  . sodium chloride flush  3 mL Intravenous Q12H   I have reviewed the patient's current medications.  ASSESSMENT: Principal Problem:   Preterm premature rupture of membranes (PPROM) with unknown onset of labor Active Problems:   Hx of preeclampsia, prior pregnancy, currently pregnant   Previous cesarean delivery, antepartum   Rh negative, antepartum   Pregnancy complicated by chronic hepatitis C, antepartum (HCC)   Hx of drug abuse   History of  gestational diabetes in prior pregnancy, currently pregnant   Marijuana use   PLAN: 1. PPROM  Status post betamethasone 7/27 and 7/28  Oral latency antibiotics day 3/5  Last growth ultrasound on 7/28  NST reassuring for GA: 2 strips reviewed  Continue NSTs every shift  2. H/o c/s x2  Possible vaginal delivery if begins to labor  3. [redacted] week gestation  Will need 2-hour GTT next week  (approximately 1 or more weeks after betamethasone administration)  4. Rh neg  Rhogam  5. H/o GDM   Continue routine antenatal care.   Levie HeritageStinson, Jacob J, DO 07/08/2018,8:48 AM

## 2018-07-09 ENCOUNTER — Inpatient Hospital Stay (HOSPITAL_BASED_OUTPATIENT_CLINIC_OR_DEPARTMENT_OTHER): Payer: Medicaid Other

## 2018-07-09 DIAGNOSIS — O4292 Full-term premature rupture of membranes, unspecified as to length of time between rupture and onset of labor: Secondary | ICD-10-CM

## 2018-07-09 DIAGNOSIS — Z3A26 26 weeks gestation of pregnancy: Secondary | ICD-10-CM

## 2018-07-09 NOTE — Progress Notes (Signed)
CSW saw MOB and FOB in hallway.  MOB appears to be in good spirits and states she is doing well.  She smiled and reports no questions or needs at this time.

## 2018-07-09 NOTE — Progress Notes (Signed)
Patient ID: Jasmine FudgeAutumn Nicole Escamilla, female   DOB: 03-Apr-1996, 22 y.o.   MRN: [id number]  FACULTY PRACTICE ANTEPARTUM NOTE  Jasmine Taylor is a 22 y.o. [id number] at 152w6d  who is admitted for PROM.   Fetal presentation is cephalic. Length of Stay:  6  Days  Subjective: Patient doing well. No complaints. Still leaking small amount of clear fluid. No abd tenderness, nausea, vomiting, fever, chills.  Patient reports good fetal movement.   She reports no uterine contractions She reports no bleeding  She reports loss of fluid per vagina.  Vitals:  Blood pressure 102/67, pulse 84, temperature 98 F (36.7 C), temperature source Oral, resp. rate 18, height 4\' 11"  (1.499 m), weight 150 lb (68 kg), SpO2 96 %, unknown if currently breastfeeding. Physical Examination:  General appearance - alert, well appearing, and in no distress Chest - clear to auscultation, no wheezes, rales or rhonchi, symmetric air entry Heart - normal rate, regular rhythm, normal S1, S2, no murmurs, rubs, clicks or gallops Abdomen - soft, nontender, nondistended, no masses or organomegaly Fundal Height:  size equals dates Extremities: extremities normal, atraumatic, no cyanosis or edema and Homans sign is negative, no sign of DVT  Membranes:ruptured  Fetal Monitoring: NST x3 reviewed: Baseline: 145 bpm, Variability: Good {> 6 bpm), Accelerations: Reactive and Decelerations: Absent  Labs:  Results for orders placed or performed during the hospital encounter of 07/03/18 (from the past 24 hour(s))  Type and screen [facility]   Collection Time: 07/09/18  6:40 AM  Result Value Ref Range   ABO/RH(D) A NEG    Antibody Screen POS    Sample Expiration      07/12/2018 Performed at [facility], [address] [place], [place] [address]    Antibody Identification PENDING     Imaging Studies:       Medications:  Scheduled . amoxicillin  500 mg Oral Q8H  . docusate sodium  100 mg Oral Daily  .  prenatal multivitamin  1 tablet Oral Q1200  . sodium chloride flush  3 mL Intravenous Q12H   I have reviewed the patient's current medications.  ASSESSMENT: Principal Problem:   Preterm premature rupture of membranes (PPROM) with unknown onset of labor Active Problems:   Hx of preeclampsia, prior pregnancy, currently pregnant   Previous cesarean delivery, antepartum   Rh negative, antepartum   Pregnancy complicated by chronic hepatitis C, antepartum (HCC)   Hx of drug abuse   History of gestational diabetes in prior pregnancy, currently pregnant   Marijuana use   PLAN: 1. PPROM  Status post betamethasone 7/27 and7/28  Oral latency antibiotics day 3/5  Last growth ultrasound on 7/28  MFM recommends weekly US  NST reassuring for GA: 3strips reviewed  Continue NSTs every shift  2. H/o c/s x2  Possible vaginal delivery if begins to labor  3. [redacted] week gestation  Will need 2-hour GTT next week (approximately 1 or more weeks after betamethasone administration)  4. Rh neg  Rhogam  5. H/o GDM   Continue routine antenatal care.   Jasmine Taylor, Jasmine J, DO 07/09/2018,11:21 AM

## 2018-07-10 DIAGNOSIS — O42912 Preterm premature rupture of membranes, unspecified as to length of time between rupture and onset of labor, second trimester: Principal | ICD-10-CM

## 2018-07-10 DIAGNOSIS — Z3A27 27 weeks gestation of pregnancy: Secondary | ICD-10-CM

## 2018-07-10 NOTE — Progress Notes (Signed)
Patient ID: Christin FudgeAutumn Nicole Ruddock, female   DOB: 1996/08/18, 22 y.o.   MRN: [id number] Patient ID: Christin FudgeAutumn Nicole Helmkamp, female   DOB: 1996/08/18, 22 y.o.   MRN: [id number]  FACULTY PRACTICE ANTEPARTUM NOTE  Jasmen Gae Bonicole Eddings is a 22 y.o. [id number] at 27weeks  who is admitted for PROM.   Fetal presentation is cephalic. Length of Stay:  7  Days  Subjective: Patient doing well. No complaints. Still leaking small amount of clear fluid. No  nausea, vomiting, fever, chills.  Patient reports good fetal movement.   She reports no uterine contractions She reports no bleeding  She reports loss of fluid per vagina.  Blood pressure (!) 148/94, pulse (!) 104, temperature 98.9 F (37.2 C), temperature source Oral, resp. rate 16, height 4\' 11"  (1.499 m), weight 68 kg (150 lb), SpO2 94 %, unknown if currently breastfeeding.  Physical Examination:  General appearance - alert, well appearing, and in no distress  Abdomen - soft, nontender, nondistended, no masses or organomegaly Fundal Height:  size equals dates Extremities: extremities normal, atraumatic, no cyanosis or edema and Homans sign is negative, no sign of DVT  Membranes:ruptured  Fetal Monitoring: NST reviewed: Baseline: 145 bpm, Variability: Good {> 6 bpm), Accelerations: Reactive and Decelerations: Absent  Fetal Heart Rate A  Mode External filed at 07/09/2018 2222  Baseline Rate (A) 145 bpm filed at 07/09/2018 2222  Variability 6-25 BPM filed at 07/09/2018 2222  Accelerations 15 x 15 filed at 07/09/2018 2222  Decelerations None filed at 07/09/2018 2222    Labs:        Results for orders placed or performed during the hospital encounter of 07/03/18 (from the past 24 hour(s))  Type and screen [facility] [place]   Collection Time: 07/09/18  6:40 AM  Result Value Ref Range   ABO/RH(D) A NEG    Antibody Screen POS    Sample Expiration      07/12/2018 Performed at [facility], [address]  [place], [place] [address]    Antibody Identification PENDING     Imaging Studies:   none    Medications:  Scheduled . amoxicillin  500 mg Oral Q8H  . docusate sodium  100 mg Oral Daily  . prenatal multivitamin  1 tablet Oral Q1200  . sodium chloride flush  3 mL Intravenous Q12H   I have reviewed the patient's current medications.  ASSESSMENT: Principal Problem:   Preterm premature rupture of membranes (PPROM) with unknown onset of labor Active Problems:   Hx of preeclampsia, prior pregnancy, currently pregnant   Previous cesarean delivery, antepartum   Rh negative, antepartum   Pregnancy complicated by chronic hepatitis C, antepartum (HCC)   Hx of drug abuse   History of gestational diabetes in prior pregnancy, currently pregnant   Marijuana use   PLAN: 1. PPROM  Status post betamethasone 7/27 and7/28  Oral latency antibioticsday 3/5  Last growth ultrasound on 7/28  MFM recommends weekly US  NSTreassuring   Continue NSTs every shift  2. H/o c/s x2  Possible vaginal delivery if begins to labor  3. [redacted] week gestation  Will need 2-hour GTT next week (approximately 1 or more weeks after betamethasone administration)  4. Rh neg  Rhogam  5. H/o GDM  6. Elevated BP x 1 o/w normal  Continue routine antenatal care.  Adam PhenixArnold, James G, MD 07/10/2018

## 2018-07-10 NOTE — Progress Notes (Signed)
Patient declines fetal monitoring at this time. I asked her to call me when she is ready to be placed on EFM and she agreed to do so.

## 2018-07-11 DIAGNOSIS — O42913 Preterm premature rupture of membranes, unspecified as to length of time between rupture and onset of labor, third trimester: Secondary | ICD-10-CM

## 2018-07-11 NOTE — Progress Notes (Signed)
Patient ID: Jasmine Taylor, female   DOB: August 10, 1996, 22 y.o.   MRN: [id number]  FACULTY PRACTICE ANTEPARTUM NOTE  Jasmine Taylor is a 22 y.o. [id number] at 6411w1d  who is admitted for PROM.   Fetal presentation is cephalic. Length of Stay:  7  Days  Subjective: Patient doing well. No complaints. Still leaking small amount of clear fluid. No  nausea, vomiting, fever, chills.  Patient reports good fetal movement.   She reports no uterine contractions She reports no bleeding  She reports loss of fluid per vagina.  Blood pressure 105/71, pulse 92, temperature 98.3 F (36.8 C), temperature source Oral, resp. rate 16, height 4\' 11"  (1.499 m), weight 150 lb (68 kg), SpO2 99 %, unknown if currently breastfeeding.  Physical Examination:  General appearance - alert, well appearing, and in no distress  Abdomen - soft, nontender, nondistended, no masses or organomegaly Fundal Height:  size equals dates Extremities: extremities normal, atraumatic, no cyanosis or edema and Homans sign is negative, no sign of DVT  Membranes:ruptured  Fetal Monitoring: NST reviewed: Baseline: 150 bpm, Variability: Good {> 6 bpm), Accelerations:10x 10 and Decelerations: variable decel   Labs:        Results for orders placed or performed during the hospital encounter of 07/03/18 (from the past 24 hour(s))  Type and screen [facility] [place]   Collection Time: 07/09/18  6:40 AM  Result Value Ref Range   ABO/RH(D) A NEG    Antibody Screen POS    Sample Expiration      07/12/2018 Performed at [facility], [address] [place], [place] [address]    Antibody Identification PENDING     Imaging Studies:   none    Medications:  Scheduled . amoxicillin  500 mg Oral Q8H  . docusate sodium  100 mg Oral Daily  . prenatal multivitamin  1 tablet Oral Q1200  . sodium chloride flush  3 mL Intravenous Q12H   I have reviewed the patient's current  medications.  ASSESSMENT: Principal Problem:   Preterm premature rupture of membranes (PPROM) with unknown onset of labor Active Problems:   Hx of preeclampsia, prior pregnancy, currently pregnant   Previous cesarean delivery, antepartum   Rh negative, antepartum   Pregnancy complicated by chronic hepatitis C, antepartum (HCC)   Hx of drug abuse   History of gestational diabetes in prior pregnancy, currently pregnant   Marijuana use   PLAN: 1. PPROM  Status post betamethasone 7/27 and7/28  completed latency  Last growth ultrasound on 7/28  Weekly ultrasounds per MFM  NSTreassuring   Continue NSTs every shift  Continue monitoring for sign/sx of chorio  2. H/o c/s x2  Considering vaginal delivery if begins to labor  3. [redacted] week gestation  Will need 2-hour GTT next week (approximately 1 or more weeks after betamethasone administration)  4. Rh neg  Rhogam on 7/30  5. H/o GDM   Continue routine antenatal care.  Catalina Antiguaonstant, Satvik Parco, MD 07/11/2018

## 2018-07-12 NOTE — Progress Notes (Signed)
Patient ID: Christin FudgeAutumn Nicole Durfey, female   DOB: 1995/12/20, 22 y.o.   MRN: [id number]   FACULTY PRACTICE ANTEPARTUM NOTE  Rosemarie Joni Reiningicole Toomeyis a [age] y.Q.I6N6295o.G8P2052 at 27.2 weeks who is admitted for PROM.  Fetal presentation iscephalic. Length of Stay:9Days  Subjective: Patient doing well. No complaints. Still leaking small amount of clear fluid. No  nausea, vomiting, fever, chills. Had large carie and broken upper molar, no pain Patient reports good fetal movement.  She reportsnouterine contractions She reportsnobleeding  She reports loss of fluid per vagina.  Blood pressure (!) 95/56, pulse 82, temperature 98 F (36.7 C), resp. rate 16, height 4\' 11"  (1.499 m), weight 68 kg (150 lb), SpO2 100 %, unknown if currently breastfeeding.   Physical Examination: General appearance -alert, well appearing, and in no distress  Abdomen -soft, nontender, nondistended, no masses or organomegaly Fundal Height:size equals dates Extremities:extremities normal, atraumatic, no cyanosis or edema and Homans sign is negative, no sign of DVT Membranes:ruptured  Fetal Monitoring:NST reviewed:Baseline:145bpm, Variability: Good {> 6 bpm), Accelerations:Reactiveand Decelerations: Absent         Results for orders placed or performed during the hospital encounter of 07/03/18 (from the past 24 hour(s))  Type and screen [facility] [place]   Collection Time: 07/09/18 6:40 AM  Result Value Ref Range   ABO/RH(D) A NEG    Antibody Screen POS    Sample Expiration      07/12/2018 Performed at [facility], [address] [place], [place] [address]    Antibody Identification PENDING     Imaging Studies: none   Medications: Scheduled . amoxicillin 500 mg Oral Q8H  . docusate sodium 100 mg Oral Daily  . prenatal multivitamin 1 tablet Oral Q1200  . sodium chloride flush 3 mL Intravenous Q12H   I have reviewed the patient's  current medications.  ASSESSMENT: Principal Problem: Preterm premature rupture of membranes (PPROM) with unknown onset of labor Active Problems: Hx of preeclampsia, prior pregnancy, currently pregnant Previous cesarean delivery, antepartum Rh negative, antepartum Pregnancy complicated by chronic hepatitis C, antepartum (HCC) Hx of drug abuse History of gestational diabetes in prior pregnancy, currently pregnant Marijuana use   PLAN: 1. PPROM  Status post betamethasone 7/27 and7/28  Oral latency antibioticsday 3/5  Last growth ultrasound on 7/28  MFM recommends weekly US  NSTreassuring   Continue NSTs every shift  2. H/o c/s x2  Possible vaginal delivery if begins to labor  3. [redacted] week gestation  Will need 2-hour GTT next week (approximately 1 or more weeks after betamethasone administration)  4. Rh neg  Rhogam  5. H/o GDM  6. Elevated BP x 1 o/w normal  Continue routine antenatal care.  Adam PhenixArnold, Amani Marseille G, MD 07/12/2018 9:16 AM

## 2018-07-12 NOTE — Progress Notes (Signed)
Attempted to visit pt to introduce spiritual care services and offer support while she is in the hospital, but pt was sleeping when I attempted to visit.  Will continue to follow.      07/12/18 1300  Clinical Encounter Type  Visited With Patient not available

## 2018-07-13 LAB — TYPE AND SCREEN
ABO/RH(D): A NEG
Antibody Screen: POSITIVE
Unit division: 0
Unit division: 0

## 2018-07-13 LAB — BPAM RBC
Blood Product Expiration Date: 201908212359
Blood Product Expiration Date: 201908222359
Unit Type and Rh: 9500
Unit Type and Rh: 9500

## 2018-07-13 LAB — GLUCOSE, FASTING: Glucose, Fasting: 77 mg/dL (ref 70–99)

## 2018-07-13 LAB — GLUCOSE, 2 HOUR: Glucose, 2 hour: 121 mg/dL (ref 70–139)

## 2018-07-13 LAB — GLUCOSE TOLERANCE, 1 HOUR: Glucose, 1 Hour GTT: 103 mg/dL (ref 70–140)

## 2018-07-13 NOTE — Progress Notes (Signed)
2 hour GTT drink provided to pt. Pt instructed no food and water only to drink until after 2nd blood draw at 0755. Verbalized understanding and compliance.

## 2018-07-13 NOTE — Progress Notes (Signed)
Patient ID: Jasmine Taylor, female   DOB: 02/03/1996, 22 y.o.   MRN: [id number] FACULTY PRACTICE ANTEPARTUM(COMPREHENSIVE) NOTE  Jasmine Taylor is a 22 y.o. [id number] at 1877w3d by best clinical estimate who is admitted for PROM.   Fetal presentation is cephalic. Length of Stay:  10  Days  Subjective:  Patient reports the fetal movement as active. Patient reports uterine contraction  activity as none. Patient reports  vaginal bleeding as none. Patient describes fluid per vagina as Clear.  Vitals:  Blood pressure (!) 105/58, pulse 76, temperature 98.1 F (36.7 C), temperature source Oral, resp. rate 16, height 4\' 11"  (1.499 m), weight 68 kg (150 lb), SpO2 97 %, unknown if currently breastfeeding. Physical Examination:  General appearance - alert, well appearing, and in no distress Heart - normal rate and regular rhythm Abdomen - soft, nontender, nondistended Fundal Height:  size equals dates and no tenderness Cervical Exam: Not evaluated. Extremities: extremities normal, atraumatic, no cyanosis or edema and Homans sign is negative, no sign of DVT  Membranes:ruptured  Fetal Monitoring:  NST reviewed  Fetal Heart Rate A  Mode External filed at 07/12/2018 2233  Baseline Rate (A) 145 bpm filed at 07/12/2018 2233  Variability <5 BPM, 6-25 BPM filed at 07/12/2018 2233  Accelerations 15 x 15 filed at 07/12/2018 2233  Decelerations Variable filed at 07/12/2018 2233     Labs:  Results for orders placed or performed during the hospital encounter of 07/03/18 (from the past 24 hour(s))  Glucose, fasting   Collection Time: 07/13/18  5:14 AM  Result Value Ref Range   Glucose, Fasting 77 70 - 99 mg/dL  Glucose tolerance, 1 hour   Collection Time: 07/13/18  6:58 AM  Result Value Ref Range   Glucose, 1 Hour GTT 103 70 - 140 mg/dL  Glucose, 2 hour   Collection Time: 07/13/18  8:01 AM  Result Value Ref Range   Glucose, 2 hour 121 70 - 139 mg/dL     Medications:  Scheduled .  docusate sodium  100 mg Oral Daily  . prenatal multivitamin  1 tablet Oral Q1200  . sodium chloride flush  3 mL Intravenous Q12H   I have reviewed the patient's current medications.  ASSESSMENT: Patient Active Problem List   Diagnosis Date Noted  . Preterm premature rupture of membranes (PPROM) with unknown onset of labor 07/03/2018  . Marijuana use 07/03/2018  . History of gestational diabetes in prior pregnancy, currently pregnant 05/07/2017  . 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 Hx of drug abuse 03/20/2017  . Rh negative, antepartum 02/12/2017  . Supervision of high risk pregnancy, antepartum 01/14/2017  . Hx of preeclampsia, prior pregnancy, currently pregnant 01/14/2017  . Previous cesarean delivery, antepartum 01/14/2017  . Bipolar disorder (HCC) 01/14/2017    PLAN: Continue present management for PPROM. Nl BG screening  Scheryl DarterJames Arnold 07/13/2018,10:29 AM

## 2018-07-14 ENCOUNTER — Inpatient Hospital Stay (HOSPITAL_COMMUNITY): Payer: Medicaid Other | Admitting: Anesthesiology

## 2018-07-14 ENCOUNTER — Encounter (HOSPITAL_COMMUNITY): Payer: Self-pay

## 2018-07-14 LAB — TYPE AND SCREEN
ABO/RH(D): A NEG
Antibody Screen: POSITIVE
Unit division: 0
Unit division: 0

## 2018-07-14 LAB — CBC
HCT: 34.1 % — ABNORMAL LOW (ref 36.0–46.0)
Hemoglobin: 11.8 g/dL — ABNORMAL LOW (ref 12.0–15.0)
MCH: 30.9 pg (ref 26.0–34.0)
MCHC: 34.6 g/dL (ref 30.0–36.0)
MCV: 89.3 fL (ref 78.0–100.0)
Platelets: 198 10*3/uL (ref 150–400)
RBC: 3.82 MIL/uL — ABNORMAL LOW (ref 3.87–5.11)
RDW: 14.7 % (ref 11.5–15.5)
WBC: 26 10*3/uL — ABNORMAL HIGH (ref 4.0–10.5)

## 2018-07-14 LAB — BPAM RBC
BLOOD PRODUCT EXPIRATION DATE: 201908212359
BLOOD PRODUCT EXPIRATION DATE: 201908222359
Unit Type and Rh: 9500
Unit Type and Rh: 9500

## 2018-07-14 MED ORDER — LACTATED RINGERS IV SOLN: 500.0000 mL | INTRAVENOUS | Status: DC | PRN

## 2018-07-14 MED ORDER — FENTANYL CITRATE (PF) 100 MCG/2ML IJ SOLN
INTRAMUSCULAR | Status: AC
  Filled 2018-07-14: qty 2

## 2018-07-14 MED ORDER — ONDANSETRON HCL 4 MG/2ML IJ SOLN
4.0000 mg | Freq: Four times a day (QID) | INTRAMUSCULAR | Status: DC | PRN
  Administered 2018-07-15: 4 mg via INTRAVENOUS
  Filled 2018-07-14: qty 2

## 2018-07-14 MED ORDER — PHENYLEPHRINE 40 MCG/ML (10ML) SYRINGE FOR IV PUSH (FOR BLOOD PRESSURE SUPPORT): 80.0000 ug | PREFILLED_SYRINGE | INTRAVENOUS | Status: DC | PRN

## 2018-07-14 MED ORDER — SOD CITRATE-CITRIC ACID 500-334 MG/5ML PO SOLN
30.0000 mL | ORAL | Status: DC | PRN
  Administered 2018-07-16: 30 mL via ORAL

## 2018-07-14 MED ORDER — MAGNESIUM SULFATE 40 G IN LACTATED RINGERS - SIMPLE
2.0000 g/h | INTRAVENOUS | Status: DC
  Filled 2018-07-14: qty 500

## 2018-07-14 MED ORDER — LACTATED RINGERS IV SOLN
INTRAVENOUS | Status: DC
  Administered 2018-07-14 – 2018-07-16 (×4): via INTRAVENOUS

## 2018-07-14 MED ORDER — PHENYLEPHRINE 40 MCG/ML (10ML) SYRINGE FOR IV PUSH (FOR BLOOD PRESSURE SUPPORT)
80.0000 ug | PREFILLED_SYRINGE | INTRAVENOUS | Status: DC | PRN
  Filled 2018-07-14: qty 10

## 2018-07-14 MED ORDER — LIDOCAINE HCL (PF) 1 % IJ SOLN
INTRAMUSCULAR | Status: DC | PRN
  Administered 2018-07-14: 4 mL via EPIDURAL
  Administered 2018-07-14: 5 mL via EPIDURAL

## 2018-07-14 MED ORDER — MAGNESIUM SULFATE BOLUS VIA INFUSION
4.0000 g | Freq: Once | INTRAVENOUS | Status: AC
  Administered 2018-07-14: 4 g via INTRAVENOUS
  Filled 2018-07-14: qty 500

## 2018-07-14 MED ORDER — LIDOCAINE HCL (PF) 1 % IJ SOLN: 30.0000 mL | INTRAMUSCULAR | Status: DC | PRN

## 2018-07-14 MED ORDER — FENTANYL 2.5 MCG/ML BUPIVACAINE 1/10 % EPIDURAL INFUSION (WH - ANES)
14.0000 mL/h | INTRAMUSCULAR | Status: DC | PRN
  Administered 2018-07-14: 12 mL/h via EPIDURAL
  Administered 2018-07-15 (×2): 14 mL/h via EPIDURAL
  Filled 2018-07-14 (×3): qty 100

## 2018-07-14 MED ORDER — OXYTOCIN BOLUS FROM INFUSION: 500.0000 mL | Freq: Once | INTRAVENOUS | Status: DC

## 2018-07-14 MED ORDER — OXYCODONE-ACETAMINOPHEN 5-325 MG PO TABS: 2.0000 | ORAL_TABLET | ORAL | Status: DC | PRN

## 2018-07-14 MED ORDER — OXYCODONE-ACETAMINOPHEN 5-325 MG PO TABS: 1.0000 | ORAL_TABLET | ORAL | Status: DC | PRN

## 2018-07-14 MED ORDER — EPHEDRINE 5 MG/ML INJ: 10.0000 mg | INTRAVENOUS | Status: DC | PRN

## 2018-07-14 MED ORDER — FENTANYL CITRATE (PF) 100 MCG/2ML IJ SOLN
100.0000 ug | Freq: Once | INTRAMUSCULAR | Status: AC
  Administered 2018-07-14: 100 ug via INTRAVENOUS

## 2018-07-14 MED ORDER — OXYTOCIN 40 UNITS IN LACTATED RINGERS INFUSION - SIMPLE MED: 2.5000 [IU]/h | INTRAVENOUS | Status: DC

## 2018-07-14 MED ORDER — ACETAMINOPHEN 325 MG PO TABS
650.0000 mg | ORAL_TABLET | ORAL | Status: DC | PRN
  Administered 2018-07-14 – 2018-07-15 (×3): 650 mg via ORAL
  Filled 2018-07-14 (×3): qty 2

## 2018-07-14 MED ORDER — LACTATED RINGERS IV SOLN
500.0000 mL | Freq: Once | INTRAVENOUS | Status: AC
  Administered 2018-07-14: 500 mL via INTRAVENOUS

## 2018-07-14 MED ORDER — DIPHENHYDRAMINE HCL 50 MG/ML IJ SOLN
12.5000 mg | INTRAMUSCULAR | Status: DC | PRN
  Administered 2018-07-14: 12.5 mg via INTRAVENOUS
  Filled 2018-07-14: qty 1

## 2018-07-14 NOTE — Anesthesia Pain Management Evaluation Note (Signed)
  [facility] Pain Management Visit Note  Patient: Jasmine FudgeAutumn Nicole Howry, 22 y.o., female  "Hello I am a member of the anesthesia team at [facility]. We have an anesthesia team available at all times to provide care throughout the hospital, including epidural management and anesthesia for C-section. I don't know your plan for the delivery whether it a natural birth, water birth, IV sedation, nitrous supplementation, doula or epidural, but we want to meet your pain goals."   1.Was your pain managed to your expectations on prior hospitalizations?   Yes   2.What is your expectation for pain management during this hospitalization?     Epidural  3.How can we help you reach that goal? Epidural in place  Record the patient's initial score and the patient's pain goal.   Pain: 0  Pain Goal: 4 [facility] wants you to be able to say your pain was always managed very well.  Koral Thaden 07/14/2018

## 2018-07-14 NOTE — Progress Notes (Signed)
Patient ID: Christin FudgeAutumn Nicole Steel, female   DOB: November 29, 1996, 22 y.o.   MRN: [id number] FACULTY PRACTICE ANTEPARTUM(COMPREHENSIVE) NOTE  Emira Gae Bonicole Harman is a 22 y.o. [id number] at 2270w4d by best clinical estimate who is admitted for PROM.   Fetal presentation is cephalic. Length of Stay:  11  Days  Subjective:  Patient reports the fetal movement as active. Patient reports uterine contraction  activity as none. Patient reports  vaginal bleeding as none. Patient describes fluid per vagina as clear  Vitals:  Blood pressure 116/75, pulse (!) 102, temperature 98.9 F (37.2 C), temperature source Oral, resp. rate 18, height 4\' 11"  (1.499 m), weight 68 kg (150 lb), SpO2 99 %, unknown if currently breastfeeding. Physical Examination:  General appearance - alert, well appearing, and in no distress Heart - normal rate and regular rhythm Abdomen - soft, nontender, nondistended Fundal Height:  size equals dates Cervical Exam: Not evaluated.. Extremities: extremities normal, atraumatic, no cyanosis or edema and Homans sign is negative, no sign of DVT Membranes:ruptured  Fetal Monitoring:  Baseline: 150 bpm, Variability: Fair (1-6 bpm), Accelerations: Non-reactive but appropriate for gestational age and Decelerations: Variable: mild  Labs:  No results found for this or any previous visit (from the past 24 hour(s)).    Medications:  Scheduled . docusate sodium  100 mg Oral Daily  . prenatal multivitamin  1 tablet Oral Q1200  . sodium chloride flush  3 mL Intravenous Q12H   I have reviewed the patient's current medications.  ASSESSMENT: Patient Active Problem List   Diagnosis Date Noted  . Preterm premature rupture of membranes (PPROM) with unknown onset of labor 07/03/2018  . Marijuana use 07/03/2018  . History of gestational diabetes in prior pregnancy, currently pregnant 05/07/2017  . 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 Hx of drug abuse  03/20/2017  . Rh negative, antepartum 02/12/2017  . Supervision of high risk pregnancy, antepartum 01/14/2017  . Hx of preeclampsia, prior pregnancy, currently pregnant 01/14/2017  . Previous cesarean delivery, antepartum 01/14/2017  . Bipolar disorder (HCC) 01/14/2017    PLAN: Continue present hospital management  Scheryl DarterJames Monish Haliburton 07/14/2018,1:34 PM

## 2018-07-14 NOTE — Anesthesia Preprocedure Evaluation (Signed)
Anesthesia Evaluation  Patient identified by MRN, date of birth, ID band Patient awake    Reviewed: Allergy & Precautions, NPO status , Patient's Chart, lab work & pertinent test results  History of Anesthesia Complications Negative for: history of anesthetic complications  Airway Mallampati: II  TM Distance: >3 FB Neck ROM: Full    Dental   Pulmonary Current Smoker,    breath sounds clear to auscultation       Cardiovascular  Rhythm:Regular Rate:Tachycardia     Neuro/Psych PSYCHIATRIC DISORDERS Anxiety Depression Bipolar Disorder negative neurological ROS     GI/Hepatic negative GI ROS, (+)     substance abuse  marijuana use, Hepatitis -, C  Endo/Other  negative endocrine ROS  Renal/GU negative Renal ROS  negative genitourinary   Musculoskeletal negative musculoskeletal ROS (+)   Abdominal   Peds  (+) ADHD Hematology  (+) anemia ,   Anesthesia Other Findings   Reproductive/Obstetrics (+) Pregnancy  Pre-eclampsia with prior pregnancy                              Anesthesia Physical Anesthesia Plan  ASA: II  Anesthesia Plan: Epidural   Post-op Pain Management:    Induction:   PONV Risk Score and Plan: 2 and Treatment may vary due to age or medical condition  Airway Management Planned: Natural Airway  Additional Equipment: None  Intra-op Plan:   Post-operative Plan:   Informed Consent: I have reviewed the patients History and Physical, chart, labs and discussed the procedure including the risks, benefits and alternatives for the proposed anesthesia with the patient or authorized representative who has indicated his/her understanding and acceptance.     Plan Discussed with: Anesthesiologist  Anesthesia Plan Comments: (Labs reviewed. Platelets acceptable, patient not taking any blood thinning medications. Per RN, FHR tracing reported to be stable enough for sitting  procedure. Elevated WBC due to betamethasone administration per OB, no plans to treat with antibiotics. Risks and benefits discussed with patient, including PDPH, backache, epidural hematoma, failed epidural, allergic reaction, and nerve injury. Patient expressed understanding and wished to proceed.)        Anesthesia Quick Evaluation

## 2018-07-14 NOTE — Progress Notes (Signed)
Jasmine Taylor is 22 y.o. [id number] female at 3755w4d who was admitted to ANTE on 7/27 for PPROM. Today, patient began to have increased frequency and severity of ctx. Was checked by RN and found to be 3/50/0 (previously 1cm/thick). Transferred to L&D for expectant management.   FHT: 155 bpm, good variability, +acels, no decels  Toco: Mild ctx q5-6 minutes   Planning to get epidural. On Magnesium. Has received latency abx and BMZx2.   Marcy Sirenatherine Wallace, D.O. OB Fellow  07/14/2018, 7:50 PM

## 2018-07-14 NOTE — Progress Notes (Signed)
Patient ID: Christin FudgeAutumn Nicole Hegarty, female   DOB: 27-Jun-1996, 22 y.o.   MRN: [id number]  Introduced self to patient. She is not currently having any symptoms or pain.  Told patient we were not rushing anything and letting her body do what it's going to do on its own.   Patient is currently on Mag. Has received BMZx2 dose on 7/27-28.  Patient's affect is flattened and responses are delayed.  Has Hx of drug use.    FHT: 140 bpm.  Mod. Var. Reactive. No decels.   BP 133/84   Pulse (!) 104   Temp 97.8 F (36.6 C) (Axillary)   Resp 16   Ht 4\' 11"  (1.499 m)   Wt 68 kg (150 lb)   LMP  (LMP Unknown)   SpO2 97%   BMI 30.30 kg/m   Dilation: 3 Effacement (%): 50 Station: 0 Exam by:: d warren rn

## 2018-07-14 NOTE — Anesthesia Procedure Notes (Signed)
Epidural Patient location during procedure: OB Start time: 07/14/2018 8:03 PM End time: 07/14/2018 8:07 PM  Staffing Anesthesiologist: Beryle LatheBrock, Thomas E, MD Performed: anesthesiologist   Preanesthetic Checklist Completed: patient identified, pre-op evaluation, timeout performed, IV checked, risks and benefits discussed and monitors and equipment checked  Epidural Patient position: sitting Prep: DuraPrep Patient monitoring: continuous pulse ox and blood pressure Approach: midline Location: L2-L3 Injection technique: LOR saline  Needle:  Needle type: Tuohy  Needle gauge: 17 G Needle length: 9 cm Needle insertion depth: 5 cm Catheter size: 19 Gauge Catheter at skin depth: 10 cm Test dose: negative and Other (1% lidocaine)  Additional Notes Patient identified. Risks including, but not limited to, bleeding, infection, nerve damage, paralysis, inadequate analgesia, blood pressure changes, nausea, vomiting, allergic reaction, postpartum back pain, itching, and headache were discussed. Patient expressed understanding and wished to proceed. Sterile prep and drape, including hand hygiene, mask, and sterile gloves were used. The patient was positioned and the spine was prepped. The skin was anesthetized with lidocaine. No paraesthesia or other complication noted. The patient did not experience any signs of intravascular injection such as tinnitus or metallic taste in mouth, nor signs of intrathecal spread such as rapid motor block. Please see nursing notes for vital signs. The patient tolerated the procedure well.   Leslye Peerhomas Brock, MDReason for block:procedure for pain

## 2018-07-15 ENCOUNTER — Encounter (HOSPITAL_COMMUNITY): Payer: Self-pay | Admitting: *Deleted

## 2018-07-15 MED ORDER — FENTANYL 2.5 MCG/ML BUPIVACAINE 1/10 % EPIDURAL INFUSION (WH - ANES): 14.0000 mL/h | INTRAMUSCULAR | Status: DC | PRN

## 2018-07-15 NOTE — Progress Notes (Signed)
Patient ID: Christin FudgeAutumn Nicole Clock, female   DOB: 1996-05-04, 22 y.o.   MRN: [id number]  Micah FlesherWent to patient's room.  She was sleeping comfortably.  FHT strip looks good.  125 bpm.  Moderate variabiilty.  Present accels.  No decels.  Contractions appearing every 8-10 minutes.    BP (!) 105/56   Pulse 94   Temp 98 F (36.7 C) (Oral)   Resp 16   Ht 4\' 11"  (1.499 m)   Wt 68 kg   LMP  (LMP Unknown)   SpO2 97%   BMI 30.30 kg/m   Dilation: 3 Effacement (%): 50 Station: 0 Exam by:: d warren rn

## 2018-07-15 NOTE — Progress Notes (Signed)
Faculty Note  Patient is 22 yo [phone] @ 7258w5d with h/o 2 x CS who was noted to be in labor, progressed to 3/50/0. She was put on MgSO4 for neuroprotection and MgSO4 dc/d this am. Comfortable with epidural. She is contracting every 10 minutes or so. Will cont to monitor for progression of labor.   Baldemar LenisK. Meryl My Madariaga, M.D. [facility]

## 2018-07-15 NOTE — Progress Notes (Signed)
Faculty Note  [facility] S/p ANCS and latency abx. Started having increased contractions yesterday and transferred to L&D. S/p 12 hours of MgSO4, stopped early this am. With epidural in place. She has had irregular contractions, was about every ten min, now every 15. She is not feeling anything. Will stop epidural for now and see if she progresses.  Reviewed risks/benefits of TOLAC versus RCS in detail. Patient counseled regarding potential vaginal delivery, chance of success, future implications, possible uterine rupture and need for urgent/emergent repeat cesarean. Counseled regarding potential need for repeat c-section for unrelated reasons. Counseled regarding repeat cesarean including risks of bleeding, infection, damage to surrounding tissue, abnormal placentation, implications for future pregnancies. All questions answered.  Patient desires to proceed with TOLAC, consent previously signed.  Also reviewed bilateral tubal ligation with patient, she previously signed papers. She is now unsure and does not want to proceed with BTL.    Baldemar LenisK. Meryl Davis, M.D. [facility]

## 2018-07-15 NOTE — Anesthesia Postprocedure Evaluation (Deleted)
Anesthesia Post Note  Patient: Jasmine FudgeAutumn Nicole Taylor  Procedure(s) Performed: AN AD HOC LABOR EPIDURAL     Patient location during evaluation: Mother Baby Anesthesia Type: Epidural Level of consciousness: awake and alert Pain management: pain level controlled Vital Signs Assessment: post-procedure vital signs reviewed and stable Respiratory status: spontaneous breathing, nonlabored ventilation and respiratory function stable Cardiovascular status: stable Postop Assessment: no headache, no backache and epidural receding Anesthetic complications: no    Last Vitals:  Vitals:   07/15/18 1601 07/15/18 1631  BP: 110/62 110/67  Pulse: 98 (!) 104  Resp:  16  Temp:    SpO2:      Last Pain:  Vitals:   07/15/18 1330  TempSrc: Oral  PainSc:    Pain Goal: Patients Stated Pain Goal: 3 (07/09/18 1920)               Makael Stein

## 2018-07-16 ENCOUNTER — Encounter (HOSPITAL_COMMUNITY): Payer: Self-pay | Admitting: Anesthesiology

## 2018-07-16 ENCOUNTER — Encounter (HOSPITAL_COMMUNITY)
Admission: AD | Disposition: A | Discharge status: Home / Self Care | Payer: Self-pay | Source: Home / Self Care | Attending: Family Medicine

## 2018-07-16 DIAGNOSIS — Z3A27 27 weeks gestation of pregnancy: Secondary | ICD-10-CM

## 2018-07-16 DIAGNOSIS — O34211 Maternal care for low transverse scar from previous cesarean delivery: Secondary | ICD-10-CM

## 2018-07-16 DIAGNOSIS — O42113 Preterm premature rupture of membranes, onset of labor more than 24 hours following rupture, third trimester: Secondary | ICD-10-CM

## 2018-07-16 LAB — RAPID URINE DRUG SCREEN, HOSP PERFORMED
Amphetamines: NOT DETECTED
Barbiturates: NOT DETECTED
Benzodiazepines: NOT DETECTED
Cocaine: NOT DETECTED
OPIATES: NOT DETECTED
TETRAHYDROCANNABINOL: NOT DETECTED

## 2018-07-16 LAB — CREATININE, SERUM
Creatinine, Ser: 0.49 mg/dL (ref 0.44–1.00)
GFR calc Af Amer: >60 mL/min (ref >60)
GFR calc non Af Amer: >60 mL/min (ref >60)

## 2018-07-16 SURGERY — Surgical Case
Anesthesia: Epidural

## 2018-07-16 MED ORDER — ONDANSETRON HCL 4 MG/2ML IJ SOLN
INTRAMUSCULAR | Status: AC
  Filled 2018-07-16: qty 2

## 2018-07-16 MED ORDER — FENTANYL 2.5 MCG/ML BUPIVACAINE 1/10 % EPIDURAL INFUSION (WH - ANES)
INTRAMUSCULAR | Status: AC
  Filled 2018-07-16: qty 100

## 2018-07-16 MED ORDER — BUPIVACAINE HCL (PF) 0.25 % IJ SOLN
INTRAMUSCULAR | Status: DC | PRN
  Administered 2018-07-16: 30 mL

## 2018-07-16 MED ORDER — CEFAZOLIN SODIUM-DEXTROSE 2-4 GM/100ML-% IV SOLN: 2.0000 g | INTRAVENOUS | Status: DC

## 2018-07-16 MED ORDER — KETOROLAC TROMETHAMINE 30 MG/ML IJ SOLN
30.0000 mg | Freq: Four times a day (QID) | INTRAMUSCULAR | Status: AC | PRN
  Administered 2018-07-16: 30 mg via INTRAMUSCULAR

## 2018-07-16 MED ORDER — KETOROLAC TROMETHAMINE 30 MG/ML IJ SOLN: 30.0000 mg | Freq: Four times a day (QID) | INTRAMUSCULAR | Status: AC | PRN

## 2018-07-16 MED ORDER — COCONUT OIL OIL: 1.0000 "application " | TOPICAL_OIL | Status: DC | PRN

## 2018-07-16 MED ORDER — OXYTOCIN 10 UNIT/ML IJ SOLN
INTRAMUSCULAR | Status: AC
Start: 2018-07-16 — End: ?
  Filled 2018-07-16: qty 4

## 2018-07-16 MED ORDER — NALBUPHINE HCL 10 MG/ML IJ SOLN: 5.0000 mg | Freq: Once | INTRAMUSCULAR | Status: DC | PRN

## 2018-07-16 MED ORDER — KETAMINE HCL 10 MG/ML IJ SOLN
INTRAMUSCULAR | Status: DC | PRN
  Administered 2018-07-16 (×4): 10 mg via INTRAVENOUS

## 2018-07-16 MED ORDER — NALBUPHINE HCL 10 MG/ML IJ SOLN: 5.0000 mg | INTRAMUSCULAR | Status: DC | PRN

## 2018-07-16 MED ORDER — LIDOCAINE-EPINEPHRINE (PF) 2 %-1:200000 IJ SOLN
INTRAMUSCULAR | Status: DC | PRN
  Administered 2018-07-16: 5 mL via EPIDURAL

## 2018-07-16 MED ORDER — LIDOCAINE-EPINEPHRINE (PF) 2 %-1:200000 IJ SOLN
INTRAMUSCULAR | Status: AC
  Filled 2018-07-16: qty 20

## 2018-07-16 MED ORDER — OXYCODONE HCL 5 MG PO TABS
5.0000 mg | ORAL_TABLET | ORAL | Status: DC | PRN
  Administered 2018-07-17 (×2): 5 mg via ORAL
  Filled 2018-07-16 (×2): qty 1

## 2018-07-16 MED ORDER — SODIUM CHLORIDE 0.9 % IV SOLN
500.0000 mg | Freq: Once | INTRAVENOUS | Status: AC
  Administered 2018-07-16: 500 mg via INTRAVENOUS
  Filled 2018-07-16: qty 500

## 2018-07-16 MED ORDER — ZOLPIDEM TARTRATE 5 MG PO TABS: 5.0000 mg | ORAL_TABLET | Freq: Every evening | ORAL | Status: DC | PRN

## 2018-07-16 MED ORDER — BUPIVACAINE HCL (PF) 0.25 % IJ SOLN
INTRAMUSCULAR | Status: DC | PRN
  Administered 2018-07-16: 5 mL via EPIDURAL

## 2018-07-16 MED ORDER — LACTATED RINGERS IV SOLN: INTRAVENOUS | Status: DC | PRN

## 2018-07-16 MED ORDER — DIPHENHYDRAMINE HCL 50 MG/ML IJ SOLN
12.5000 mg | INTRAMUSCULAR | Status: DC | PRN
  Administered 2018-07-16: 12.5 mg via INTRAVENOUS
  Filled 2018-07-16: qty 1

## 2018-07-16 MED ORDER — NALOXONE HCL 0.4 MG/ML IJ SOLN: 0.4000 mg | INTRAMUSCULAR | Status: DC | PRN

## 2018-07-16 MED ORDER — DEXTROSE IN LACTATED RINGERS 5 % IV SOLN: INTRAVENOUS | Status: DC

## 2018-07-16 MED ORDER — LIDOCAINE-EPINEPHRINE (PF) 2 %-1:200000 IJ SOLN
INTRAMUSCULAR | Status: DC | PRN
  Administered 2018-07-16: 5 mL via EPIDURAL
  Administered 2018-07-16: 3 mL via EPIDURAL

## 2018-07-16 MED ORDER — IBUPROFEN 600 MG PO TABS
600.0000 mg | ORAL_TABLET | Freq: Four times a day (QID) | ORAL | Status: DC
  Administered 2018-07-16 – 2018-07-19 (×9): 600 mg via ORAL
  Filled 2018-07-16 (×11): qty 1

## 2018-07-16 MED ORDER — DIPHENHYDRAMINE HCL 25 MG PO CAPS: 25.0000 mg | ORAL_CAPSULE | Freq: Four times a day (QID) | ORAL | Status: DC | PRN

## 2018-07-16 MED ORDER — DIPHENHYDRAMINE HCL 25 MG PO CAPS: 25.0000 mg | ORAL_CAPSULE | ORAL | Status: DC | PRN

## 2018-07-16 MED ORDER — SIMETHICONE 80 MG PO CHEW
80.0000 mg | CHEWABLE_TABLET | ORAL | Status: DC | PRN
Start: 2018-07-16 — End: 2018-07-19

## 2018-07-16 MED ORDER — WITCH HAZEL-GLYCERIN EX PADS: 1.0000 "application " | MEDICATED_PAD | CUTANEOUS | Status: DC | PRN

## 2018-07-16 MED ORDER — SIMETHICONE 80 MG PO CHEW
80.0000 mg | CHEWABLE_TABLET | ORAL | Status: DC
  Administered 2018-07-16 – 2018-07-19 (×3): 80 mg via ORAL
  Filled 2018-07-16 (×3): qty 1

## 2018-07-16 MED ORDER — MEPERIDINE HCL 25 MG/ML IJ SOLN: 6.2500 mg | INTRAMUSCULAR | Status: DC | PRN

## 2018-07-16 MED ORDER — OXYTOCIN 40 UNITS IN LACTATED RINGERS INFUSION - SIMPLE MED: 2.5000 [IU]/h | INTRAVENOUS | Status: AC

## 2018-07-16 MED ORDER — SENNOSIDES-DOCUSATE SODIUM 8.6-50 MG PO TABS
2.0000 | ORAL_TABLET | ORAL | Status: DC
  Administered 2018-07-16 – 2018-07-19 (×3): 2 via ORAL
  Filled 2018-07-16 (×3): qty 2

## 2018-07-16 MED ORDER — MEASLES, MUMPS & RUBELLA VAC ~~LOC~~ INJ
0.5000 mL | INJECTION | Freq: Once | SUBCUTANEOUS | Status: DC
  Filled 2018-07-16: qty 0.5

## 2018-07-16 MED ORDER — ONDANSETRON HCL 4 MG/2ML IJ SOLN: 4.0000 mg | Freq: Three times a day (TID) | INTRAMUSCULAR | Status: DC | PRN

## 2018-07-16 MED ORDER — NALOXONE HCL 4 MG/10ML IJ SOLN: 1.0000 ug/kg/h | INTRAVENOUS | Status: DC | PRN

## 2018-07-16 MED ORDER — PROMETHAZINE HCL 25 MG/ML IJ SOLN: 6.2500 mg | INTRAMUSCULAR | Status: DC | PRN

## 2018-07-16 MED ORDER — TETANUS-DIPHTH-ACELL PERTUSSIS 5-2.5-18.5 LF-MCG/0.5 IM SUSP: 0.5000 mL | Freq: Once | INTRAMUSCULAR | Status: DC

## 2018-07-16 MED ORDER — FENTANYL CITRATE (PF) 100 MCG/2ML IJ SOLN
INTRAMUSCULAR | Status: AC
  Filled 2018-07-16: qty 2

## 2018-07-16 MED ORDER — DIBUCAINE 1 % RE OINT: 1.0000 "application " | TOPICAL_OINTMENT | RECTAL | Status: DC | PRN

## 2018-07-16 MED ORDER — ENOXAPARIN SODIUM 40 MG/0.4ML ~~LOC~~ SOLN
40.0000 mg | SUBCUTANEOUS | Status: DC
  Administered 2018-07-17 – 2018-07-19 (×3): 40 mg via SUBCUTANEOUS
  Filled 2018-07-16 (×3): qty 0.4

## 2018-07-16 MED ORDER — MIDAZOLAM HCL 2 MG/2ML IJ SOLN
INTRAMUSCULAR | Status: DC | PRN
  Administered 2018-07-16 (×2): 1 mg via INTRAVENOUS

## 2018-07-16 MED ORDER — ACETAMINOPHEN 325 MG PO TABS
650.0000 mg | ORAL_TABLET | ORAL | Status: DC | PRN
  Administered 2018-07-18: 650 mg via ORAL
  Filled 2018-07-16: qty 2

## 2018-07-16 MED ORDER — ONDANSETRON HCL 4 MG/2ML IJ SOLN
INTRAMUSCULAR | Status: DC | PRN
  Administered 2018-07-16: 4 mg via INTRAVENOUS

## 2018-07-16 MED ORDER — SOD CITRATE-CITRIC ACID 500-334 MG/5ML PO SOLN
ORAL | Status: AC
  Filled 2018-07-16: qty 15

## 2018-07-16 MED ORDER — MENTHOL 3 MG MT LOZG: 1.0000 | LOZENGE | OROMUCOSAL | Status: DC | PRN

## 2018-07-16 MED ORDER — MORPHINE SULFATE (PF) 0.5 MG/ML IJ SOLN
INTRAMUSCULAR | Status: DC | PRN
  Administered 2018-07-16: 3 mg via EPIDURAL

## 2018-07-16 MED ORDER — KETAMINE HCL 10 MG/ML IJ SOLN
INTRAMUSCULAR | Status: AC
Start: 2018-07-16 — End: ?
  Filled 2018-07-16: qty 1

## 2018-07-16 MED ORDER — FENTANYL CITRATE (PF) 100 MCG/2ML IJ SOLN: 100.0000 ug | Freq: Once | INTRAMUSCULAR | Status: DC

## 2018-07-16 MED ORDER — OXYCODONE HCL 5 MG PO TABS
10.0000 mg | ORAL_TABLET | ORAL | Status: DC | PRN
  Administered 2018-07-17 – 2018-07-19 (×6): 10 mg via ORAL
  Filled 2018-07-16 (×6): qty 2

## 2018-07-16 MED ORDER — LACTATED RINGERS IV SOLN: INTRAVENOUS | Status: DC

## 2018-07-16 MED ORDER — OXYTOCIN 10 UNIT/ML IJ SOLN
INTRAMUSCULAR | Status: DC | PRN
  Administered 2018-07-16: 40 [IU] via INTRAMUSCULAR

## 2018-07-16 MED ORDER — MORPHINE SULFATE (PF) 0.5 MG/ML IJ SOLN
INTRAMUSCULAR | Status: AC
  Filled 2018-07-16: qty 10

## 2018-07-16 MED ORDER — BUPIVACAINE HCL (PF) 0.25 % IJ SOLN
INTRAMUSCULAR | Status: AC
  Filled 2018-07-16: qty 40

## 2018-07-16 MED ORDER — CEFAZOLIN SODIUM-DEXTROSE 2-3 GM-%(50ML) IV SOLR
INTRAVENOUS | Status: DC | PRN
  Administered 2018-07-16: 2 g via INTRAVENOUS

## 2018-07-16 MED ORDER — KETOROLAC TROMETHAMINE 30 MG/ML IJ SOLN
INTRAMUSCULAR | Status: AC
  Filled 2018-07-16: qty 1

## 2018-07-16 MED ORDER — FENTANYL CITRATE (PF) 100 MCG/2ML IJ SOLN
INTRAMUSCULAR | Status: DC | PRN
  Administered 2018-07-16: 100 ug via EPIDURAL

## 2018-07-16 MED ORDER — MIDAZOLAM HCL 2 MG/2ML IJ SOLN
INTRAMUSCULAR | Status: AC
  Filled 2018-07-16: qty 2

## 2018-07-16 MED ORDER — FENTANYL CITRATE (PF) 100 MCG/2ML IJ SOLN: 25.0000 ug | INTRAMUSCULAR | Status: DC | PRN

## 2018-07-16 MED ORDER — SODIUM CHLORIDE 0.9% FLUSH: 3.0000 mL | INTRAVENOUS | Status: DC | PRN

## 2018-07-16 MED ORDER — SCOPOLAMINE 1 MG/3DAYS TD PT72
MEDICATED_PATCH | TRANSDERMAL | Status: AC
  Filled 2018-07-16: qty 1

## 2018-07-16 MED ORDER — SCOPOLAMINE 1 MG/3DAYS TD PT72
1.0000 | MEDICATED_PATCH | Freq: Once | TRANSDERMAL | Status: AC
  Administered 2018-07-16: 1.5 mg via TRANSDERMAL

## 2018-07-16 MED ORDER — LACTATED RINGERS IV SOLN
INTRAVENOUS | Status: DC | PRN
  Administered 2018-07-16: 11:00:00 via INTRAVENOUS

## 2018-07-16 MED ORDER — SIMETHICONE 80 MG PO CHEW
80.0000 mg | CHEWABLE_TABLET | Freq: Three times a day (TID) | ORAL | Status: DC
  Administered 2018-07-17 – 2018-07-19 (×6): 80 mg via ORAL
  Filled 2018-07-16 (×7): qty 1

## 2018-07-16 MED ORDER — PRENATAL MULTIVITAMIN CH
1.0000 | ORAL_TABLET | Freq: Every day | ORAL | Status: DC
  Administered 2018-07-17 – 2018-07-19 (×3): 1 via ORAL
  Filled 2018-07-16 (×3): qty 1

## 2018-07-16 SURGICAL SUPPLY — 36 items
BENZOIN TINCTURE PRP APPL 2/3 (GAUZE/BANDAGES/DRESSINGS) ×3 IMPLANT
CHLORAPREP W/TINT 26ML (MISCELLANEOUS) ×3 IMPLANT
CLAMP CORD UMBIL (MISCELLANEOUS) IMPLANT
CLOSURE STERI STRIP 1/2 X4 (GAUZE/BANDAGES/DRESSINGS) ×2 IMPLANT
CLOSURE WOUND 1/2 X4 (GAUZE/BANDAGES/DRESSINGS) ×1
CLOTH BEACON ORANGE TIMEOUT ST (SAFETY) ×3 IMPLANT
DRSG OPSITE POSTOP 4X10 (GAUZE/BANDAGES/DRESSINGS) ×3 IMPLANT
DRSG PAD ABDOMINAL 8X10 ST (GAUZE/BANDAGES/DRESSINGS) ×3 IMPLANT
ELECT REM PT RETURN 9FT ADLT (ELECTROSURGICAL) ×3
ELECTRODE REM PT RTRN 9FT ADLT (ELECTROSURGICAL) ×1 IMPLANT
EXTRACTOR VACUUM M CUP 4 TUBE (SUCTIONS) IMPLANT
EXTRACTOR VACUUM M CUP 4' TUBE (SUCTIONS)
GAUZE SPONGE 4X4 12PLY STRL (GAUZE/BANDAGES/DRESSINGS) ×6 IMPLANT
GLOVE BIOGEL PI IND STRL 7.0 (GLOVE) ×2 IMPLANT
GLOVE BIOGEL PI INDICATOR 7.0 (GLOVE) ×4
GLOVE ECLIPSE 7.0 STRL STRAW (GLOVE) ×6 IMPLANT
GOWN STRL REUS W/TWL LRG LVL3 (GOWN DISPOSABLE) ×6 IMPLANT
HEMOSTAT ARISTA ABSORB 3G PWDR (MISCELLANEOUS) ×3 IMPLANT
KIT ABG SYR 3ML LUER SLIP (SYRINGE) IMPLANT
NEEDLE HYPO 22GX1.5 SAFETY (NEEDLE) ×3 IMPLANT
NEEDLE HYPO 25X5/8 SAFETYGLIDE (NEEDLE) IMPLANT
NS IRRIG 1000ML POUR BTL (IV SOLUTION) ×3 IMPLANT
PACK C SECTION WH (CUSTOM PROCEDURE TRAY) ×3 IMPLANT
PAD ABD 7.5X8 STRL (GAUZE/BANDAGES/DRESSINGS) ×3 IMPLANT
PAD OB MATERNITY 4.3X12.25 (PERSONAL CARE ITEMS) ×3 IMPLANT
PENCIL SMOKE EVAC W/HOLSTER (ELECTROSURGICAL) ×3 IMPLANT
RTRCTR C-SECT PINK 25CM LRG (MISCELLANEOUS) ×3 IMPLANT
SPONGE GAUZE 4X4 12PLY STER LF (GAUZE/BANDAGES/DRESSINGS) ×3 IMPLANT
STRIP CLOSURE SKIN 1/2X4 (GAUZE/BANDAGES/DRESSINGS) ×2 IMPLANT
SUT PLAIN 2 0 XLH (SUTURE) ×3 IMPLANT
SUT VIC AB 0 CTX 36 (SUTURE) ×6
SUT VIC AB 0 CTX36XBRD ANBCTRL (SUTURE) ×3 IMPLANT
SUT VIC AB 4-0 KS 27 (SUTURE) ×3 IMPLANT
SYR 30ML LL (SYRINGE) ×3 IMPLANT
TOWEL OR 17X24 6PK STRL BLUE (TOWEL DISPOSABLE) ×3 IMPLANT
TRAY FOLEY W/BAG SLVR 14FR LF (SET/KITS/TRAYS/PACK) ×3 IMPLANT

## 2018-07-16 NOTE — Addendum Note (Signed)
Addendum  created 07/16/18 1213 by Earmon PhoenixWilkerson, Nyonna Hargrove P, CRNA   Charge Capture section accepted, Intraprocedure Event edited, Intraprocedure Flowsheets edited, Intraprocedure Staff edited, Patient device removed, Sign clinical note

## 2018-07-16 NOTE — Progress Notes (Signed)
Pt called out to desk and stated she is having some ctxs. Went to pts room and pt states she has had a few ctxs but they have went away once she empties her bladder. Toco adjusted. Abd soft to palpation at this time.

## 2018-07-16 NOTE — Progress Notes (Addendum)
Patient from 3 cm-->4-->5, ctx's q 2 mins. Transverse and unable to be turned. Will proceed with abdominal delivery.

## 2018-07-16 NOTE — Interval H&P Note (Signed)
History and Physical Interval Note:  07/16/2018 9:45 AM  Jasmine Taylor  has presented today for surgery, with the diagnosis of active  Preterm labor and transverse lie, and prior C-section x 2 The various methods of treatment have been discussed with the patient and family. After consideration of risks, benefits and other options for treatment, the patient has consented to  Procedure(s): CESAREAN SECTION (N/A) as a surgical intervention .  The patient's history has been reviewed, patient examined, no change in status, stable for surgery.  I have reviewed the patient's chart and labs.  Questions were answered to the patient's satisfaction.     Reva Boresanya S Aimar Borghi

## 2018-07-16 NOTE — Addendum Note (Signed)
Addendum  created 07/16/18 0950 by Shelton SilvasHollis, Kevin D, MD   Intraprocedure Staff edited

## 2018-07-16 NOTE — Op Note (Signed)
Preoperative Diagnosis:  IUP @ 3476w6d, Preterm labor 5 cm, transverse lie, previous C-section x 2  Postoperative Diagnosis:  Same  Procedure: Repeat low transverse cesarean section  Surgeon: Tinnie Gensanya Pratt, M.D.  Assistant: Rhett BannisterLaurel Wallace, MD  Findings: Viable female infant, APGAR (1 MIN): 5   APGAR (5 MINS): 7  Transverse with shoulder presentation  Estimated blood loss: 1000 cc  Complications: None known  Specimens: Placenta to pathology  Reason for procedure: Briefly, the patient is a 22 y.o. [id number] 6676w6d who presents for RCS. Admitted 13 days prior to delivery with PPROM. S/p BMZ and latency antibiotics. Began contracting 2 days prior to delivery [facility] and given epidural and Magnesium for CP prophylaxis. No change x 48 hour and Magnesium and epidural turned off. 12 hours later, began contracting and making cervical change. Infant noted to be transverse and C-section recommended.  Procedure: Patient is a to the OR where spinal analgesia was administered. She was then placed in a supine position with left lateral tilt. She received 2 g of Ancef and Azithromycin 500 mg IV and SCDs were in place. A timeout was performed. She was prepped and draped in the usual sterile fashion. A Foley catheter was placed in the bladder. A knife was then used to make a Pfannenstiel incision. This incision was carried out to underlying fascia which was divided in the midline with the knife. The incision was extended laterally, sharply. The fascia was dissected off the underlying rectus, bluntly laterally and sharply in the midline. The rectus was divided in the midline.  The peritoneal cavity was entered bluntly.  Alexis retractor was placed inside the incision.  A knife was used to make a low transverse incision on the uterus. This incision was carried down to the amniotic cavity was entered. Fetus was in transverse with shoulder presenting and was brought up out of the incision  Delivering  vertex without difficulty. Delayed cord clamping x 1 minute. Cord was clamped x 2 and cut. Infant taken to waiting nurse.  Cord blood was obtained. Placenta was delivered from the uterus.  Uterus was cleaned with dry lap pads. Uterine incision closed with 0 Vicryl suture in a running fashion. Several figure of eights used to achieve hemostasis and Arista placed.  Alexis retractor was removed from the abdomen. Peritoneal closure was done with 0 Vicryl suture.  Fascia is closed with 0 Vicryl suture in a running fashion. Subcutaneous tissue infused with 30cc 0.25% Marcaine.  Subcutaneous closure was performed with 0 plain suture.  Skin closed using 3-0 Vicryl on a Keith needle.  Steri strips applied, followed by pressure dressing.  All instrument, needle and lap counts were correct x 2.  Patient was awake and taken to PACU stable.  Infant remained to NICU, stable.   Reva Boresanya S Pratt MD 07/16/2018 11:31 AM

## 2018-07-16 NOTE — Addendum Note (Signed)
Addendum  created 07/16/18 1045 by Shelton SilvasHollis, Hala Narula D, MD   Intraprocedure Event deleted, Intraprocedure Meds edited, Intraprocedure Staff edited

## 2018-07-16 NOTE — Progress Notes (Signed)
FHR 140's. Cat 1. No ctx on EFM or felt by pt.  Will keep on L&D overnight and hopefully be able to move back to ante in the am.   Vitals:   07/15/18 2102 07/15/18 2153  BP: (!) 94/57 114/63  Pulse: 100 (!) 101  Resp:    Temp:    SpO2:

## 2018-07-16 NOTE — Anesthesia Postprocedure Evaluation (Signed)
Anesthesia Post Note  Patient: Jasmine FudgeAutumn Nicole Taylor  Procedure(s) Performed: CESAREAN SECTION (N/A )     Patient location during evaluation: PACU Anesthesia Type: Epidural Level of consciousness: oriented and awake and alert Pain management: pain level controlled Vital Signs Assessment: post-procedure vital signs reviewed and stable Respiratory status: spontaneous breathing, respiratory function stable and patient connected to nasal cannula oxygen Cardiovascular status: blood pressure returned to baseline and stable Postop Assessment: no headache, no backache, no apparent nausea or vomiting, epidural receding and patient able to bend at knees Anesthetic complications: no    Last Vitals:  Vitals:   07/16/18 1215 07/16/18 1230  BP: 112/77 113/77  Pulse: 85 88  Resp: 20 11  Temp: 36.9 C   SpO2: 99% 99%    Last Pain:  Vitals:   07/16/18 1230  TempSrc:   PainSc: 0-No pain   Pain Goal: Patients Stated Pain Goal: 0 (07/15/18 1832)  LLE Motor Response: Purposeful movement (07/16/18 1230) LLE Sensation: Numbness;Tingling (07/16/18 1230) RLE Motor Response: Purposeful movement (07/16/18 1230) RLE Sensation: Numbness;Tingling (07/16/18 1230)      Shelton SilvasKevin D Jathan Balling

## 2018-07-16 NOTE — Addendum Note (Signed)
Addendum  created 07/16/18 1140 by Bethena Midgetddono, Wilsie Kern, MD   Delete clinical note

## 2018-07-16 NOTE — Addendum Note (Signed)
Addendum  created 07/16/18 1239 by Shelton SilvasHollis, Kevin D, MD   Sign clinical note

## 2018-07-16 NOTE — Progress Notes (Signed)
C/o lots of pressure, crying hysterically. SSE: 4/70/-2. Baby transverse confirmed w/US.  Now pt much clamer now, claiming ctx now 5 minutes rather than 1 minutes.  To discuss w/Dr Shawnie PonsPratt now

## 2018-07-16 NOTE — Progress Notes (Signed)
Pt reports ctx q 10-15 minutes over the last hour, crying.  Requests pain medication. Discussed nitrous would be a good option since ctx are still far apart and would be cleared quickly, not affecting baby.  Now c/o ctx q 1-2 minutes. Abdomen soft, not palpable or tracing on EFW.  Instructed FOB to record the time each ctx begins.  Dr. Earlene Plateravis aware, no more tocolysis at this point. FHR Cat 1

## 2018-07-16 NOTE — Transfer of Care (Signed)
Immediate Anesthesia Transfer of Care Note  Patient: Jasmine Taylor  Procedure(s) Performed: CESAREAN SECTION (N/A )  Patient Location: PACU  Anesthesia Type:Epidural  Level of Consciousness: awake and patient cooperative  Airway & Oxygen Therapy: Patient Spontanous Breathing  Post-op Assessment: Report given to RN and Post -op Vital signs reviewed and stable  Post vital signs: Reviewed and stable  Last Vitals:  Vitals Value Taken Time  BP 124/86 07/16/2018 11:32 AM  Temp    Pulse 91 07/16/2018 11:36 AM  Resp 20 07/16/2018 11:36 AM  SpO2 99 % 07/16/2018 11:36 AM  Vitals shown include unvalidated device data.  Last Pain:  Vitals:   07/16/18 0900  TempSrc: Oral  PainSc:       Patients Stated Pain Goal: 0 (07/15/18 1832)  Complications: No apparent anesthesia complications

## 2018-07-16 NOTE — Progress Notes (Signed)
Drenda FreezeFran Dishmon, CNM on unit and notified of FHR with vbl decels and possibly ctxs noted on toco with vbl decels. Pt denies any UC at this time and requesting to eat a sandwich. Provider reviewed FHR tracing. Orders received pt may eat at this time.

## 2018-07-16 NOTE — Anesthesia Postprocedure Evaluation (Signed)
Anesthesia Post Note  Patient: Christin FudgeAutumn Nicole Langford  Procedure(s) Performed: AN AD HOC LABOR EPIDURAL     Patient location during evaluation: [facility] Anesthesia Type: Epidural Level of consciousness: awake and alert Pain management: pain level controlled Vital Signs Assessment: post-procedure vital signs reviewed and stable Respiratory status: spontaneous breathing, nonlabored ventilation and respiratory function stable Cardiovascular status: stable Postop Assessment: no headache, no backache, epidural receding, able to ambulate, adequate PO intake, no apparent nausea or vomiting and patient able to bend at knees Anesthetic complications: no    Last Vitals:  Vitals:   07/16/18 1805 07/16/18 2006  BP: (!) 106/57 103/61  Pulse: 77 88  Resp: 16 16  Temp: 37.1 C 36.7 C  SpO2: 98% 99%    Last Pain:  Vitals:   07/16/18 2006  TempSrc: Oral  PainSc:    Pain Goal: Patients Stated Pain Goal: 2 (07/16/18 1440)               Tykerria Mccubbins Hristova

## 2018-07-16 NOTE — Addendum Note (Signed)
Addendum  created 07/16/18 1200 by Earmon PhoenixWilkerson, Eugenia Eldredge P, CRNA   Allergies reviewed, Attestation recorded in Intraprocedure, Charge Capture section accepted, Intraprocedure Attestations filed, Intraprocedure Devices edited, Intraprocedure Event edited, International aid/development workerntraprocedure Flowsheets edited, Intraprocedure Meds edited, Optician, dispensingntraprocedure Staff edited, Medication List reviewed, Patient device added, Patient device edited, Patient device removed, Review and Sign - Signed, Sign clinical note

## 2018-07-16 NOTE — Progress Notes (Signed)
I offered support to pt as well as FOB over several different visits over the day.  They were tearful and anxious about how he is doing and were trying to process all of the events of the day.  Chaplain Dyanne CarrelKaty Onesimo Lingard, Bcc Pager, [phone] 2:57 PM   07/16/18 1400  Clinical Encounter Type  Visited With Patient and family together  Visit Type Spiritual support

## 2018-07-17 ENCOUNTER — Encounter (HOSPITAL_COMMUNITY): Payer: Self-pay | Admitting: Family Medicine

## 2018-07-17 LAB — CBC
HCT: 25.2 % — ABNORMAL LOW (ref 36.0–46.0)
HEMOGLOBIN: 8.8 g/dL — AB (ref 12.0–15.0)
MCH: 31.8 pg (ref 26.0–34.0)
MCHC: 34.9 g/dL (ref 30.0–36.0)
MCV: 91 fL (ref 78.0–100.0)
Platelets: 205 10*3/uL (ref 150–400)
RBC: 2.77 MIL/uL — AB (ref 3.87–5.11)
RDW: 15 % (ref 11.5–15.5)
WBC: 14.2 10*3/uL — ABNORMAL HIGH (ref 4.0–10.5)

## 2018-07-17 NOTE — Lactation Note (Signed)
This note was copied from a baby's chart. Lactation Consultation Note Baby 16 hrs old. Baby [redacted] weeks gestation.  Mom has DEBP in rm. Has pumped once. FOB stated nothing came out. Discussed that's normal, cont. To pump for stimulation. Discussed milk consistency, how long it takes to come in. Discussed importance of supply and demand.   Mom has flat affect. Non-expressive. FOB spoke for mom some. Mom states she will do both br/formula.  Gave mom NICU booklet, stickers, colostrum containers, stickers, and bottles. Mom has WIC. [facility]. LC not sure of mom's committment.   Patient Name: Jasmine Taylor [id number]'s Date: 07/17/2018 Reason for consult: Initial assessment;Preterm <34wks;NICU baby;Infant < 6lbs   Maternal Data    Feeding    LATCH Score                   Interventions Interventions: DEBP  Lactation Tools Discussed/Used Tools: Pump Breast pump type: Double-Electric Breast Pump Pump Review: Setup, frequency, and cleaning;Milk Storage Initiated by:: RN Date initiated:: 07/16/18   Consult Status Consult Status: Follow-up Date: 07/18/18 Follow-up type: In-patient    Ilanna Deihl, Diamond NickelLAURA G 07/17/2018, 2:55 AM

## 2018-07-17 NOTE — Lactation Note (Signed)
This note was copied from a baby's chart. Lactation Consultation Note Baby 17 hrs old in NICU. Mom states she Bf her now 89 yr old for 3 months maybe.  LC gave mom blue dots and black dots explaining what to do. Mom has soft breast, short shaft compressible nipples w/dot of colostrum to tip of nipple. Mom has WIC. LC will fax WIC sheet.  Mom is positive for Hep. C. Asked RN to remind mom not to give baby colostrum/BM of she sees blood in BM. Discussed NICU breast storage. Encouraged to pump at least 5-6 times a day, every three hours when milk comes in.   Patient Name: Boy Idalia Needleutumn Acy WUJWJ'XToday's Date: 07/17/2018 Reason for consult: Initial assessment;Preterm <34wks;NICU baby;Infant < 6lbs   Maternal Data Has patient been taught Hand Expression?: Yes Does the patient have breastfeeding experience prior to this delivery?: Yes  Feeding    LATCH Score       Type of Nipple: Everted at rest and after stimulation(short shaft compressible)  Comfort (Breast/Nipple): Soft / non-tender        Interventions Interventions: DEBP;Hand express;Breast massage  Lactation Tools Discussed/Used Tools: Pump Breast pump type: Double-Electric Breast Pump WIC Program: Yes Pump Review: Setup, frequency, and cleaning;Milk Storage Initiated by:: RN Date initiated:: 07/16/18   Consult Status Consult Status: Follow-up Date: 07/19/18 Follow-up type: In-patient    Lemarcus Baggerly, Diamond NickelLAURA G 07/17/2018, 3:45 AM

## 2018-07-17 NOTE — Progress Notes (Addendum)
POD#1 RLTCS for transverse fetal lie, PTL. Pregnancy and labor complicate dby PPROM x 2 weeks, hx Drug use, active HCV, hx Pre-E, hx GDM, hx CS.  Subjective: no complaints and tolerating PO  Objective: Blood pressure 115/75, pulse (!) 104, temperature 98.7 F (37.1 C), resp. rate 16, height 4\' 11"NLjvpGagqV$  (1.499 m), weight 68 kg, SpO2 99 %, unknown if currently breastfeeding.  Physical Exam:  General: cooperative and no distress Lochia: appropriate Uterine Fundus: firm Incision: healing well, no significant drainage, no significant erythema DVT Evaluation: No evidence of DVT seen on physical exam.  Recent Labs    07/14/18 1747 07/17/18 0540  HGB 11.8* 8.8*  HCT 34.1* 25.2*    Assessment/Plan: Plan for discharge tomorrow  Bottle feeding Undecided on contraception Blood pressures have been wnl since delivery   LOS: 14 days   Candis Schatzatricia Dell 07/17/2018, 8:40 AM   Attestation of Attending Supervision of Resident: Evaluation and management procedures were performed by the Resident under my supervision and collaboration.  I have seen and examined the patient.  I agree with the Resident's note and Assessment and Plan.  Candelaria CelesteJacob [place] Grosser, DO Attending Physician Faculty Practice, [facility] [place]

## 2018-07-18 LAB — TYPE AND SCREEN
ABO/RH(D): A NEG
ANTIBODY SCREEN: POSITIVE
UNIT DIVISION: 0
UNIT DIVISION: 0

## 2018-07-18 LAB — BPAM RBC
BLOOD PRODUCT EXPIRATION DATE: 201908222359
Blood Product Expiration Date: 201908212359
Unit Type and Rh: 9500
Unit Type and Rh: 9500

## 2018-07-18 MED ORDER — RHO D IMMUNE GLOBULIN 1500 UNIT/2ML IJ SOSY
300.0000 ug | PREFILLED_SYRINGE | Freq: Once | INTRAMUSCULAR | Status: AC
  Administered 2018-07-18: 300 ug via INTRAMUSCULAR
  Filled 2018-07-18: qty 2

## 2018-07-18 NOTE — Progress Notes (Signed)
Subjective: Postpartum Day 2: Cesarean Delivery Patient reports incisional pain, tolerating PO, + flatus and no problems voiding.    Objective: Vital signs in last 24 hours: Temp:  [98 F (36.7 C)-99 F (37.2 C)] 98.4 F (36.9 C) (08/11 0352) Pulse Rate:  [74-101] 100 (08/11 0352) Resp:  [16] 16 (08/11 0352) BP: (105-136)/(52-88) 107/52 (08/11 0352) SpO2:  [98 %-100 %] 98 % (08/11 0352)  Physical Exam:  General: alert, cooperative and no distress Lochia: appropriate Uterine Fundus: firm Incision: healing well, no significant drainage, no dehiscence, no significant erythema DVT Evaluation: No evidence of DVT seen on physical exam. Negative Homan's sign. No cords or calf tenderness. No significant calf/ankle edema.  Recent Labs    07/17/18 0540  HGB 8.8*  HCT 25.2*    Assessment/Plan: Status post Cesarean section. Doing well postoperatively.  Continue current care. Discharge tomorrow.  Levie HeritageJacob J Stinson 07/18/2018, 7:38 AM

## 2018-07-18 NOTE — Lactation Note (Signed)
This note was copied from a baby's chart. Lactation Consultation Note  Patient Name: Jasmine Taylor Idalia Needleutumn Kogler WGNFA'OToday's Date: 07/18/2018   [facility] Visit:  Mother resting.  She has been pumping with the DEBP but states she has not obtained any colostrum yet.  Encouraged her to continue pumping every 3 hours.  She will call as needed for questions/concerns.                 Noah Pelaez R Shalane Florendo 07/18/2018, 2:11 PM

## 2018-07-19 LAB — RH IG WORKUP (INCLUDES ABO/RH)
ABO/RH(D): A NEG
FETAL SCREEN: NEGATIVE
Gestational Age(Wks): 27.6
Unit division: 0

## 2018-07-19 MED ORDER — OXYCODONE HCL 5 MG PO TABS: 5.0000 mg | ORAL_TABLET | Freq: Four times a day (QID) | ORAL | 0 refills | Status: DC | PRN

## 2018-07-19 MED ORDER — IBUPROFEN 600 MG PO TABS: 600.0000 mg | ORAL_TABLET | Freq: Four times a day (QID) | ORAL | 1 refills | Status: DC

## 2018-07-19 NOTE — Discharge Instructions (Signed)

## 2018-07-19 NOTE — Progress Notes (Signed)
Discharge instructions given to patient including follow up appointments, medication changes and postpartum care. Patient verbalized understanding.

## 2018-07-19 NOTE — Progress Notes (Signed)
Subjective: Postpartum Day 3: Cesarean Delivery for transverse lie, PPROM in labor Patient reports incisional pain, tolerating PO and no problems voiding.   Pt home conditions are difficult to discern, pt says they have shelter, live "Near here" but no address mentioned. FOB says they have no transportation but busses run in the area of their residence. Objective: Vital signs in last 24 hours: Temp:  [97.8 F (36.6 C)-99.1 F (37.3 C)] 97.8 F (36.6 C) (08/12 0810) Pulse Rate:  [80-114] 89 (08/12 0810) Resp:  [16-18] 18 (08/12 0810) BP: (105-145)/(71-87) 116/83 (08/12 0810) SpO2:  [100 %] 100 % (08/12 0810)  Physical Exam:  General: alert, cooperative and distracted Lochia: appropriate Uterine Fundus: firm Incision: healing well, no significant drainage DVT Evaluation: No evidence of DVT seen on physical exam.  Recent Labs    07/17/18 0540  HGB 8.8*  HCT 25.2*    Assessment/Plan: Status post Cesarean section. Postoperative course complicated by uncertain housing, preparation for baby  Social work consult and d/c home thereafter.Tilda Burrow.  Adelei Scobey V Salome Cozby 07/19/2018, 9:14 AM

## 2018-07-19 NOTE — Progress Notes (Signed)
No barriers to discharge.  Full documentation to follow.

## 2018-07-19 NOTE — Lactation Note (Signed)
This note was copied from a baby's chart. Lactation Consultation Note  Patient Name: Boy Idalia Needleutumn Knudtson ZOXWR'UToday's Date: 07/19/2018    [facility] Follow Up Visit:  Mother not in room; will attempt to visit later today.              Kahlil Cowans R Damonie Furney 07/19/2018, 12:36 PM

## 2018-07-19 NOTE — Discharge Summary (Signed)
Physician Discharge Summary  Patient ID: Jasmine Taylor MRN: [id number] DOB/AGE: October 23, 1996 22 y.o.  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 This is a 22 y.o. [id number] now POD#3 from a repeat c-section for transverse lie at 2726w6d after PPROM at 9247w0d. Her antepartum course was complicated by PPROM for which she received latency antibiotics, antenatal steroids. She started having contractions on hospital day 11, was started on MgSO4 for neuroprotection, had 12 hours of therapy which was then d/c'd. About a day later, she started having contractions again and progressed to 5 cm, was found to have transverse lie and was taken for repeat c-section. Her post partum course was uncomplicated. By day 2, she was ambulating, passing flatus and pain was well controlled. She was discharged home POD#3 in good condition. She has been seen by SW for living situation and h/o drug use. Baby remains in NICU is doing well. Instructions for follow up given. She will follow up in 2 weeks for incision check.   Physical exam  Vitals:   07/18/18 1919 07/19/18 0020 07/19/18 0346 07/19/18 0810  BP:  (!) 145/76 105/71 116/83  Pulse: 95 (!) 114 80 89  Resp: 18 18  16 18   Temp: 99.1 F (37.3 C) 98.9 F (37.2 C) 98.3 F (36.8 C) 97.8 F (36.6 C)  TempSrc: Oral Oral Oral Oral  SpO2: 100% 100% 100% 100%  Weight:      Height:       Physical Exam:  See progress note dated from today for exam  Postpartum contraception: Undecided  Disposition: home  Discharged Condition: good  Discharge Instructions    Call MD for:  difficulty breathing, headache or visual disturbances   Complete by:  As directed    Call MD for:  extreme fatigue   Complete by:  As directed    Call MD for:  hives   Complete by:  As directed    Call MD for:  persistant dizziness or light-headedness   Complete by:  As directed    Call MD for:  persistant nausea and vomiting   Complete by:  As directed    Call MD for:  redness, tenderness, or signs of infection (pain, swelling, redness, odor or green/yellow discharge around incision site)   Complete by:  As directed    Call MD for:  severe uncontrolled pain   Complete by:  As directed    Call MD for:  temperature >100.4   Complete by:  As directed    Diet - low sodium heart healthy   Complete by:  As directed    Increase activity slowly   Complete by:  As directed      Allergies as of 07/19/2018      Reactions   Influenza A (h1n1) Monovalent Vaccine Swelling   Latex Itching, Rash  Medication List    TAKE these medications   acetaminophen 500 MG tablet Commonly known as:  TYLENOL Take 500 mg by mouth every 6 (six) hours as needed for headache.   ibuprofen 600 MG tablet Commonly known as:  ADVIL,MOTRIN Take 1 tablet (600 mg total) by mouth every 6 (six) hours.   oxyCODONE 5 MG immediate release tablet Commonly known as:  Oxy IR/ROXICODONE Take 1 tablet (5 mg total) by mouth every 6 (six) hours as needed (pain scale 4-7).   prenatal multivitamin Tabs tablet Take 1 tablet by mouth daily at 12 noon.      Follow-up Information    [facility] Schedule an appointment as soon as  possible for a visit in 2 week(s).   Specialty:  Obstetrics and Gynecology Contact information: [address] [place] [phone]          Signed: Conan BowensKelly M Glada Wickstrom 07/19/2018, 3:31 PM

## 2018-07-19 NOTE — Lactation Note (Signed)
This note was copied from a baby's chart. Lactation Consultation Note  Patient Name: Boy Idalia Needleutumn Aken ZOXWR'UToday's Date: 07/19/2018 Reason for consult: Follow-up assessment;Infant < 6lbs;Other (Comment)(27+6 weeks)  P2 mother whose baby is in [place].  He is 27+6 weeks.  Mother has no questions/concerns regarding pumping.  She is pumping at least 8 times/24 hours and is doing hand expression after feedings.  She is transporting any EBM she obtains to NICU. Mother has no pain during pumping.  Mother will call for assistance as needed.  Support person in room.     Maternal Data Formula Feeding for Exclusion: No Has patient been taught Hand Expression?: Yes  Feeding Feeding Type: Breast Milk  LATCH Score                   Interventions    Lactation Tools Discussed/Used     Consult Status Consult Status: Follow-up Date: 07/20/18 Follow-up type: In-patient    Shanikka Wonders R Dinesh Ulysse 07/19/2018, 4:11 PM

## 2018-07-23 NOTE — Clinical Social Work Maternal (Signed)
CLINICAL SOCIAL WORK MATERNAL/CHILD NOTE  Patient Details  Name: Jasmine Taylor MRN: [id number] Date of Birth: 01-03-1996  Date:  07/19/2018  Clinical Social Worker Initiating Note:  Terri Piedra,  Date/Time: Initiated:  07/19/18/1230     Child's Name:  Jasmine Taylor   Biological Parents:  Mother, Father(Jasmine Taylor and Jasmine Taylor)   Need for Interpreter:  None   Reason for Referral:  Parental Support of Premature Babies < 12 weeks/or Critically Ill babies, Homelessness   Address:  Parents are currently homeless and applying for housing.    Phone number:  [phone] (home)     Additional phone number:   Household Members/Support Persons (HM/SP):   Household Member/Support Person 1, Household Member/Support Person 2   HM/SP Name Relationship DOB or Age  HM/SP -1 Jasmine Taylor FOB/Significant Other    HM/SP -2 Jasmine Taylor son 1  HM/SP -3        HM/SP -4        HM/SP -5        HM/SP -6        HM/SP -7        HM/SP -8          Natural Supports (not living in the home):  Children, Immediate Family(MOB has a son who lives in [place].  FOB's sister lives in [place] and is supportive.)   Professional Supports: (Was in counseling with [name].  Is open to counseling resources given by CSW)   Employment:     Type of Work: FOB is currently working as a Teacher, early years/pre:      Homebound arranged:    Museum/gallery curator Resources:  Medicaid   Other Resources:  [employer], Physicist, medical    Cultural/Religious Considerations Which May Impact Care: None stated.  Strengths:  Understanding of illness   Psychotropic Medications:         Pediatrician:       Pediatrician List:   [facility]      Pediatrician Fax Number:    Risk Factors/Current Problems:  Transportation , Basic Needs , Mental Health Concerns , Substance Use    Cognitive  State:  Able to Concentrate , Linear Thinking , Insightful , Goal Oriented , Alert    Mood/Affect:  Calm , Interested , Tearful    CSW Assessment: CSW met with MOB in her third floor room/303 to offer support and complete assessment due to baby's admission to NICU at 27.6 days.  CSW is familiar with MOB from her delivery one year ago Environmental education officer) as well as from two visits during her [facility] admission prior to this delivery.  Initial note from this admission was on July 06, 2018 and is copied below:  CSW met with patient in her third floor/303 to offer support and complete assessment for marijuana use.  CSW is familiar with this patient from her delivery last year and notes that patient has a hx of polysubstance use, unstable housing, and mental health concerns including Bipolar and Anxiety.  Patient was pleasant and welcoming of CSW's visit. Patient states she is doing well overall.  Patient reports that she was at her sister's apartment playing with her 71 year old when her water broke.  She states this is her first pregnancy with this complication.  She has an 21 year old who lives  with her mother in [place] and a 32 year old who lives with her and her "husband" (patient explained to [name] today that they are not legally married, but have been together for 5 years).  Patient states her sister/Jasmine Taylor is caring for their 22 year old while she is in the hospital.  Patient and her sister both live at the [facility] apartment complex, which patient states is part of [name].  Patient states they each have their own apartment.  FOB lives with patient and their 2 year old son Jasmine Taylor.  Patient was a part of this program when CSW met her last summer as well.  She states FOB is now working as a Social worker and she is hopeful that they will be able to get their own place outside of the program soon.  She states they both aspire to go back to school as well.  She would like to either  get her high school diploma or GED and go to school for nursing, and FOB wants to go to school for Engineering.   MOB admits to smoking marijuana and cigarettes, and denies all other substances.  She admits to cocaine use in the past, with no use in the last 2 years.  She is understanding of hospital drug screen policy and mandated reporting, as her 22 year old was born positive for marijuana and CPS was involved.  She states no CPS involvement since his birth.  She reports no further need for substance use treatment.   CSW inquired about her mental health symptoms and needs.  She reports she resumed her medication (Buspar, Adderall and Latuda) after Jasmine Taylor was born, but stopped again when she got pregnant again.  She feels like as long as "I know what's going on (regarding the pregnancy), I'm ok."  She denies need for counseling at this time and states she plans to resume medication again after delivery.  CSW discussed skills for coping with anxiety, as a complicated pregnancy and long term hospitalization can be anxiety provoking.  Patient seemed engaged and appreciative.  CSW asked if patient if she would like a tour of the NICU and if she had any questions at this time.  She did not have questions as of now, but very much wanted to see the NICU.  CSW took her in a wheelchair through the NICU, for which she stated great appreciation.  CSW asked patient to call for support/to process her feelings at any time and will check in with patient periodically otherwise.  Today, MOB reports that she has been "kicked out" of her housing program with no where to go today.  She plans to go to FOB's sister's apartment, which CSW has concerns about since this is where 16 22 year old has shelter.  MOB states she is not concerned that she will jeopardize FOB's sister's placement because she will be careful to make sure no one sees her there.  CSW asked why she has been discharged from the program and she replied that the  program does not believe she was in the hospital and therefore missing group.  MOB asked CSW to contact the program director/[name].  CSW did so willingly and also offered to write a letter verifying her hospitalization.  Ms. Berenice Primas passed the call to [facility] Neal/Director of the housing portion of the program, who states that MOB was not on her caseload, but that she understands that MOB was discharged from the program for fighting, which will  not be tolerated.  CSW informed MOB of this and she denies.  CSW explained that CSW cannot speak to this as of course CSW was not there and therefore cannot advocate for her in this case.  CSW can assist her with looking in to other resources and helping her go about talking with program staff to advocate for herself.  She was receptive and appreciative.  She immediately called [facility], part of [facility], who state they do not have a waiting list currently.  MOB is eager to apply.  CSW provided MOB and FOB with a 31 day bus pass each so they can get back and forth to the hospital and also get situated prior to baby's discharge.  MOB was very thankful.  CSW provided other housing resources as well, including [facility], [facility], and a list of income-based apartment complexes in the area.   MOB appears to be coping well with baby's premature delivery and admission to NICU, although appropriately tearful with having to leave the hospital today with her baby still admitted.  She is understanding of the reality of this, and able to process her feelings with CSW.  MOB was attentive to discussion of PMADs and states she feels comfortable reaching out to CSW if concerns arise.  CSW recommends that she go to [facility] or the [facility] walk-in-clinics if she would like to resume medication or initiate ongoing outpatient counseling, both strongly recommended by CSW.  CSW acknowledges that it is hard to accomplish this prior to  getting basic needs, such as housing, met.  CSW encouraged MOB to keep in touch with CSW for support.   CSW informed MOB of baby's eligibility for Supplemental Security Income (SSI) and how to apply if she is interested.  She is interested and therefore CSW obtained MOB's signature on a Patient Access form and provided her with a copy of baby's Admission Note documenting his gestational age and birthweight.   CSW also recommends to MOB that she apply for financial assistance through the [employer] who helps parents experiencing a NICU hospitalization and are experiencing poverty.  MOB was very thankful for the information and states she can access the internet.  CSW will provide letter of verification of baby's hospitalization once notified by agency of that the application has been completed.  CSW Plan/Description:  Psychosocial Support and Ongoing Assessment of Needs, Sudden Infant Death Syndrome (SIDS) Education, Perinatal Mood and Anxiety Disorder (PMADs) Education, Theatre stage manager Income (SSI) Information, [place], Other Information/Referral to [employer], CSW Will Continue to Monitor Umbilical Cord Tissue Drug Screen Results and Make Report if Waldron Session, [place] 07/19/2018, 2:00 PM

## 2018-08-04 ENCOUNTER — Encounter: Payer: Medicaid Other | Admitting: Obstetrics

## 2018-12-27 ENCOUNTER — Emergency Department (HOSPITAL_COMMUNITY)
Admission: EM | Admit: 2018-12-27 | Discharge: 2018-12-27 | Disposition: A | Discharge status: Home / Self Care | Payer: Medicaid Other | Attending: Emergency Medicine | Admitting: Emergency Medicine

## 2018-12-27 ENCOUNTER — Encounter (HOSPITAL_COMMUNITY): Payer: Self-pay | Admitting: *Deleted

## 2018-12-27 DIAGNOSIS — F17210 Nicotine dependence, cigarettes, uncomplicated: Secondary | ICD-10-CM | POA: Insufficient documentation

## 2018-12-27 DIAGNOSIS — Z91040 Latex allergy status: Secondary | ICD-10-CM | POA: Insufficient documentation

## 2018-12-27 DIAGNOSIS — K029 Dental caries, unspecified: Secondary | ICD-10-CM | POA: Insufficient documentation

## 2018-12-27 DIAGNOSIS — Z79899 Other long term (current) drug therapy: Secondary | ICD-10-CM | POA: Insufficient documentation

## 2018-12-27 MED ORDER — IBUPROFEN 600 MG PO TABS: 600.0000 mg | ORAL_TABLET | Freq: Four times a day (QID) | ORAL | 0 refills | Status: DC | PRN

## 2018-12-27 MED ORDER — PENICILLIN V POTASSIUM 500 MG PO TABS: 500.0000 mg | ORAL_TABLET | Freq: Three times a day (TID) | ORAL | 0 refills | Status: DC

## 2018-12-27 NOTE — ED Provider Notes (Signed)
[facility] EMERGENCY DEPARTMENT Provider Note   CSN: [id number] Arrival date & time: 12/27/18  1843     History   Chief Complaint Chief Complaint  Patient presents with  . Dental Pain    HPI Jasmine Taylor is a 23 y.o. female.  The history is provided by the patient. No language interpreter was used.  Dental Pain  Associated symptoms: no fever and no headaches     23 year old female with history of bipolar, hepatitis C, anxiety, history of drug abuse presenting for evaluation of dental pain.  Patient report for the past 4 days she has had persistent pain primarily to her left upper molar.  Described pain as a stabbing throbbing sensation radiates to her ear, worsening with chewing and with cold air.  She takes Tylenol at home without adequate relief.  She denies any associated fever, trouble swallowing, neck pain or rash.  She denies any injury.  She is currently not pregnant.  Past Medical History:  Diagnosis Date  . ADHD (attention deficit hyperactivity disorder)   . Anemia   . Anxiety   . Bipolar disorder (HCC)   . Depression   . Hepatitis C   . Pregnancy induced hypertension     Patient Active Problem List   Diagnosis Date Noted  . Preterm premature rupture of membranes (PPROM) with unknown onset of labor 07/03/2018  . Marijuana use 07/03/2018  . History of gestational diabetes in prior pregnancy, currently pregnant 05/07/2017  . 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 Hx of drug abuse (HCC) 03/20/2017  . Rh negative, antepartum 02/12/2017  . Supervision of high risk pregnancy, antepartum 01/14/2017  . Hx of preeclampsia, prior pregnancy, currently pregnant 01/14/2017  . Previous cesarean delivery, antepartum 01/14/2017  . Bipolar disorder (HCC) 01/14/2017    Past Surgical History:  Procedure Laterality Date  . CESAREAN SECTION    . CESAREAN SECTION N/A 06/28/2017   Procedure: CESAREAN SECTION;   Surgeon: Willodean Rosenthal, MD;  Location: [facility] BIRTHING SUITES;  Service: Obstetrics;  Laterality: N/A;  . CESAREAN SECTION N/A 07/16/2018   Procedure: CESAREAN SECTION;  Surgeon: Reva Bores, MD;  Location: [facility] BIRTHING SUITES;  Service: Obstetrics;  Laterality: N/A;     OB History    Gravida  8   Para  3   Term  2   Preterm  1   AB  5   Living  3     SAB  5   TAB  0   Ectopic  0   Multiple  2   Live Births  3            Home Medications    Prior to Admission medications   Medication Sig Start Date End Date Taking? Authorizing Provider  acetaminophen (TYLENOL) 500 MG tablet Take 500 mg by mouth every 6 (six) hours as needed for headache.    [provider]  ibuprofen (ADVIL,MOTRIN) 600 MG tablet Take 1 tablet (600 mg total) by mouth every 6 (six) hours. 07/19/18   Conan Bowens, MD  oxyCODONE (OXY IR/ROXICODONE) 5 MG immediate release tablet Take 1 tablet (5 mg total) by mouth every 6 (six) hours as needed (pain scale 4-7). 07/19/18   Conan Bowens, MD  Prenatal Vit-Fe Fumarate-FA (PRENATAL MULTIVITAMIN) TABS tablet Take 1 tablet by mouth daily at 12 noon.    [provider]    Family History History reviewed. No pertinent family history.  Social History Social  History   Tobacco Use  . Smoking status: Current Every Day Smoker    Packs/day: 0.50    Types: Cigarettes  . Smokeless tobacco: Never Used  Substance Use Topics  . Alcohol use: No  . Drug use: Yes    Types: Marijuana    Comment: last time was 7/26     Allergies   Influenza a (h1n1) monovalent vaccine and Latex   Review of Systems Review of Systems  Constitutional: Negative for fever.  HENT: Positive for dental problem.   Neurological: Negative for headaches.     Physical Exam Updated Vital Signs BP (!) 135/98 (BP Location: Right Arm)   Pulse (!) 106   Temp 98.4 F (36.9 C) (Oral)   Resp 18   SpO2 98%   Physical Exam Vitals signs and nursing note  reviewed.  Constitutional:      General: She is not in acute distress.    Appearance: She is well-developed.  HENT:     Head: Atraumatic.     Comments: Mouth: Moderate amount of dental decay noted to tooth #15 and 16, tenderness to palpation, mild adjacent gingival fullness without fluctuance noted.  No trismus. Eyes:     Conjunctiva/sclera: Conjunctivae normal.  Neck:     Musculoskeletal: Neck supple.  Lymphadenopathy:     Cervical: No cervical adenopathy.  Skin:    Findings: No rash.  Neurological:     Mental Status: She is alert.      ED Treatments / Results  Labs (all labs ordered are listed, but only abnormal results are displayed) Labs Reviewed - No data to display  EKG None  Radiology No results found.  Procedures Procedures (including critical care time)  Medications Ordered in ED Medications - No data to display   Initial Impression / Assessment and Plan / ED Course  I have reviewed the triage vital signs and the nursing notes.  Pertinent labs & imaging results that were available during my care of the patient were reviewed by me and considered in my medical decision making (see chart for details).     BP (!) 135/98 (BP Location: Right Arm)   Pulse (!) 106   Temp 98.4 F (36.9 C) (Oral)   Resp 18   SpO2 98%    Final Clinical Impressions(s) / ED Diagnoses   Final diagnoses:  Pain due to dental caries    ED Discharge Orders         Ordered    ibuprofen (ADVIL,MOTRIN) 600 MG tablet  Every 6 hours PRN     12/27/18 2114    penicillin v potassium (VEETID) 500 MG tablet  3 times daily     12/27/18 2114         Patient with dentalgia.  No abscess requiring immediate incision and drainage.  Exam not concerning for Ludwig's angina or pharyngeal abscess.  Will treat with PCN and NSAIDs. Pt instructed to follow-up with dentist.  Discussed return precautions. Pt safe for discharge.    Fayrene Helperran, Pranika Finks, PA-C 12/27/18 2115    Eber HongMiller, Brian,  MD 12/29/18 [phone]

## 2018-12-27 NOTE — ED Triage Notes (Signed)
Pt in c/o dental pain for the last 4-5 days

## 2018-12-27 NOTE — ED Notes (Addendum)
Patient verbalizes understanding of discharge instructions. Opportunity for questioning and answers were provided. Armband removed by staff, pt discharged from ED ambulatory.   

## 2019-03-01 ENCOUNTER — Emergency Department (HOSPITAL_COMMUNITY)
Admission: EM | Admit: 2019-03-01 | Discharge: 2019-03-01 | Disposition: A | Discharge status: Home / Self Care | Payer: Medicaid Other | Attending: Emergency Medicine | Admitting: Emergency Medicine

## 2019-03-01 ENCOUNTER — Other Ambulatory Visit: Payer: Self-pay

## 2019-03-01 ENCOUNTER — Encounter (HOSPITAL_COMMUNITY): Payer: Self-pay | Admitting: *Deleted

## 2019-03-01 DIAGNOSIS — F319 Bipolar disorder, unspecified: Secondary | ICD-10-CM | POA: Insufficient documentation

## 2019-03-01 DIAGNOSIS — F17210 Nicotine dependence, cigarettes, uncomplicated: Secondary | ICD-10-CM | POA: Insufficient documentation

## 2019-03-01 DIAGNOSIS — F909 Attention-deficit hyperactivity disorder, unspecified type: Secondary | ICD-10-CM | POA: Insufficient documentation

## 2019-03-01 DIAGNOSIS — Z3202 Encounter for pregnancy test, result negative: Secondary | ICD-10-CM | POA: Insufficient documentation

## 2019-03-01 LAB — POC URINE PREG, ED: Preg Test, Ur: NEGATIVE

## 2019-03-01 NOTE — ED Notes (Signed)
Patient verbalizes understanding of discharge instructions . Opportunity for questions and answers were provided . Armband removed by staff ,Pt discharged from ED. W/C  offered at D/C  and Declined W/C at D/C and was escorted to lobby by RN.  

## 2019-03-01 NOTE — ED Triage Notes (Signed)
PT reports positive home preg. test

## 2019-03-01 NOTE — ED Provider Notes (Signed)
[facility] EMERGENCY DEPARTMENT Provider Note   CSN: [id number] Arrival date & time: 03/01/19  1016   History   Chief Complaint Chief Complaint  Patient presents with  . Possible Pregnancy    HPI Jasmine Taylor is a 23 y.o. female.     HPI   54 YOF presents today for pregnancy test.  Patient notes she took a pregnancy test at home that was positive.  She does not know when her last menstrual cycle was.  She denies any significant points including abdominal pain vaginal bleeding or discharge.      Past Medical History:  Diagnosis Date  . ADHD (attention deficit hyperactivity disorder)   . Anemia   . Anxiety   . Bipolar disorder (HCC)   . Depression   . Hepatitis C   . Pregnancy induced hypertension     Patient Active Problem List   Diagnosis Date Noted  . Preterm premature rupture of membranes (PPROM) with unknown onset of labor 07/03/2018  . Marijuana use 07/03/2018  . History of gestational diabetes in prior pregnancy, currently pregnant 05/07/2017  . 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 Hx of drug abuse (HCC) 03/20/2017  . Rh negative, antepartum 02/12/2017  . Supervision of high risk pregnancy, antepartum 01/14/2017  . Hx of preeclampsia, prior pregnancy, currently pregnant 01/14/2017  . Previous cesarean delivery, antepartum 01/14/2017  . Bipolar disorder (HCC) 01/14/2017    Past Surgical History:  Procedure Laterality Date  . CESAREAN SECTION    . CESAREAN SECTION N/A 06/28/2017   Procedure: CESAREAN SECTION;  Surgeon: Willodean Rosenthal, MD;  Location: [facility] BIRTHING SUITES;  Service: Obstetrics;  Laterality: N/A;  . CESAREAN SECTION N/A 07/16/2018   Procedure: CESAREAN SECTION;  Surgeon: Reva Bores, MD;  Location: [facility] BIRTHING SUITES;  Service: Obstetrics;  Laterality: N/A;     OB History    Gravida  9   Para  3   Term  2   Preterm  1   AB  5   Living  3     SAB  5   TAB  0   Ectopic  0   Multiple  2   Live Births  3            Home Medications    Prior to Admission medications   Medication Sig Start Date End Date Taking? Authorizing Provider  acetaminophen (TYLENOL) 500 MG tablet Take 500 mg by mouth every 6 (six) hours as needed for headache.    [provider]  ibuprofen (ADVIL,MOTRIN) 600 MG tablet Take 1 tablet (600 mg total) by mouth every 6 (six) hours as needed. 12/27/18   Fayrene Helper, PA-C  oxyCODONE (OXY IR/ROXICODONE) 5 MG immediate release tablet Take 1 tablet (5 mg total) by mouth every 6 (six) hours as needed (pain scale 4-7). 07/19/18   Conan Bowens, MD  penicillin v potassium (VEETID) 500 MG tablet Take 1 tablet (500 mg total) by mouth 3 (three) times daily. 12/27/18   Fayrene Helper, PA-C  Prenatal Vit-Fe Fumarate-FA (PRENATAL MULTIVITAMIN) TABS tablet Take 1 tablet by mouth daily at 12 noon.    [provider]    Family History History reviewed. No pertinent family history.  Social History Social History   Tobacco Use  . Smoking status: Current Every Day Smoker    Packs/day: 0.50    Types: Cigarettes  . Smokeless tobacco: Never Used  Substance Use Topics  . Alcohol use: No  .  Drug use: Yes    Types: Marijuana    Comment: last time was 7/26     Allergies   Influenza a (h1n1) monovalent vaccine and Latex   Review of Systems Review of Systems  All other systems reviewed and are negative.    Physical Exam Updated Vital Signs BP 122/90 (BP Location: Right Arm)   Pulse 86   Temp 97.9 F (36.6 C) (Oral)   Resp 15   Ht 4\' 11"  (1.499 m)   Wt 68 kg   LMP  (LMP Unknown)   SpO2 100%   BMI 30.30 kg/m   Physical Exam Vitals signs and nursing note reviewed.  Constitutional:      Appearance: She is well-developed.  HENT:     Head: Normocephalic and atraumatic.  Eyes:     General: No scleral icterus.       Right eye: No discharge.        Left eye: No discharge.     Conjunctiva/sclera:  Conjunctivae normal.     Pupils: Pupils are equal, round, and reactive to light.  Neck:     Musculoskeletal: Normal range of motion.     Vascular: No JVD.     Trachea: No tracheal deviation.  Pulmonary:     Effort: Pulmonary effort is normal.     Breath sounds: No stridor.  Neurological:     Mental Status: She is alert and oriented to person, place, and time.     Coordination: Coordination normal.  Psychiatric:        Behavior: Behavior normal.        Thought Content: Thought content normal.        Judgment: Judgment normal.      ED Treatments / Results  Labs (all labs ordered are listed, but only abnormal results are displayed) Labs Reviewed  POC URINE PREG, ED    EKG None  Radiology No results found.  Procedures Procedures (including critical care time)  Medications Ordered in ED Medications - No data to display   Initial Impression / Assessment and Plan / ED Course  I have reviewed the triage vital signs and the nursing notes.  Pertinent labs & imaging results that were available during my care of the patient were reviewed by me and considered in my medical decision making (see chart for details).        Labs:   Imaging:  Consults:  Therapeutics:  Discharge Meds:   Assessment/Plan: Patient here for pregnancy test, negative here.  No symptoms.        Final Clinical Impressions(s) / ED Diagnoses   Final diagnoses:  Negative pregnancy test    ED Discharge Orders    None       Rosalio Loud 03/01/19 1315    Gwyneth Sprout, MD 03/02/19 2059

## 2019-03-01 NOTE — Discharge Instructions (Addendum)
Your pregnancy test was negative here.  If you have any questions or concerns please follow-up with OB/GYN.  In the future emergency room is not the appropriate place for pregnancy testing.

## 2020-04-25 IMAGING — US US MFM OB LIMITED
1 series · 15 of 24 positions shown · non-contrast
Comparison: none

[Series 1: us mfm ob limited · 15 of 24 slices shown]
[im 1/24]
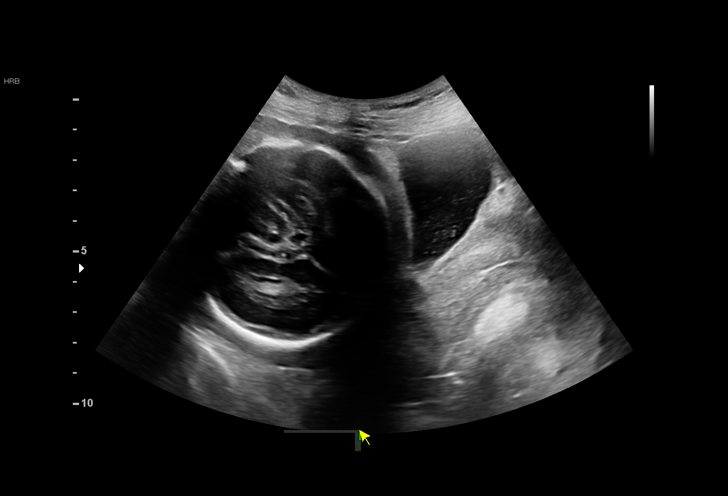
[im 3/24]
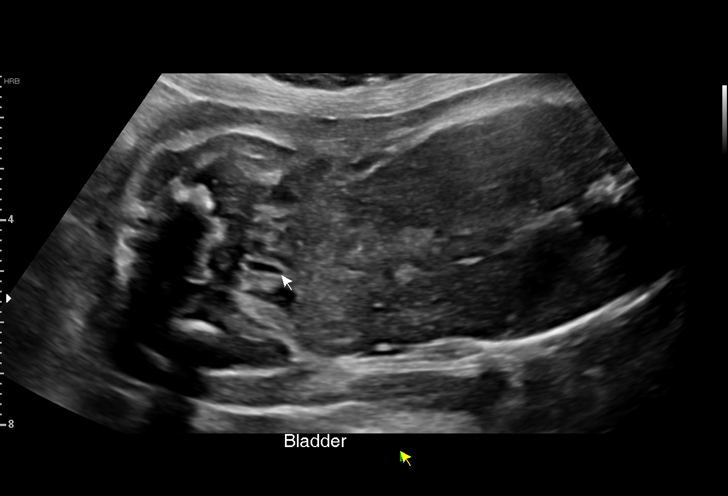
[im 5/24]
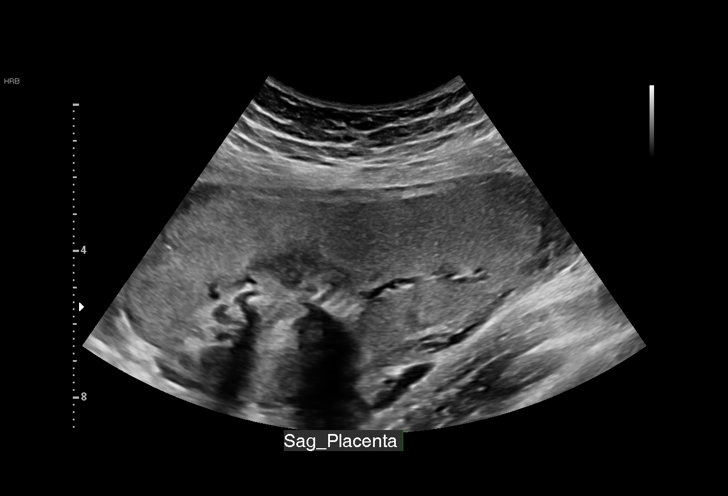
[im 6/24]
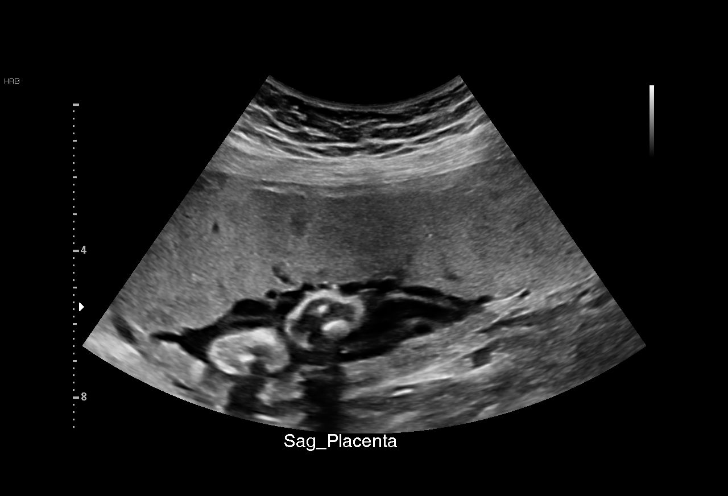
[im 8/24]
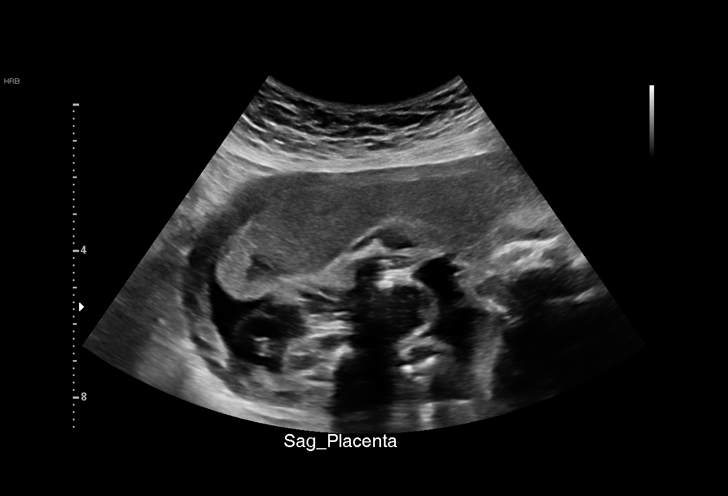
[im 9/24]
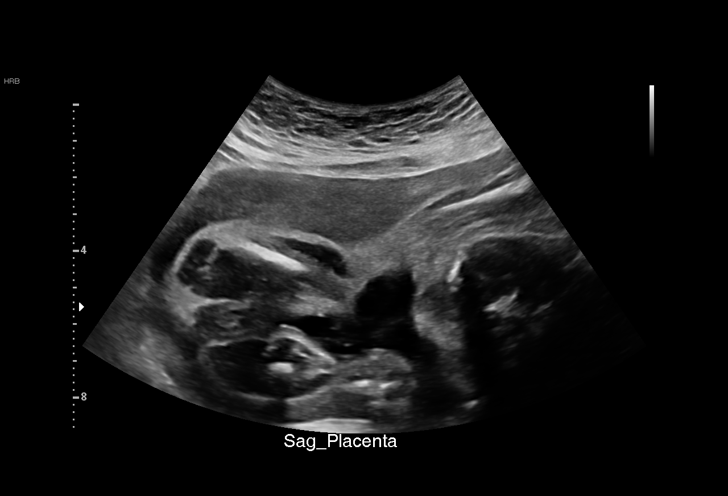
[im 11/24]
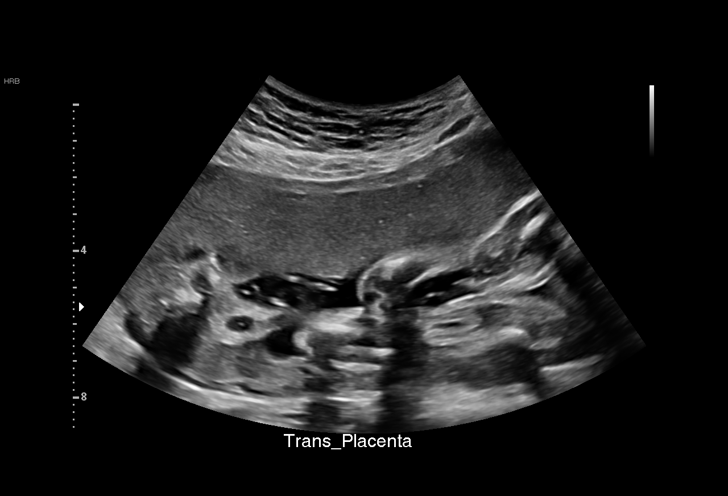
[im 13/24]
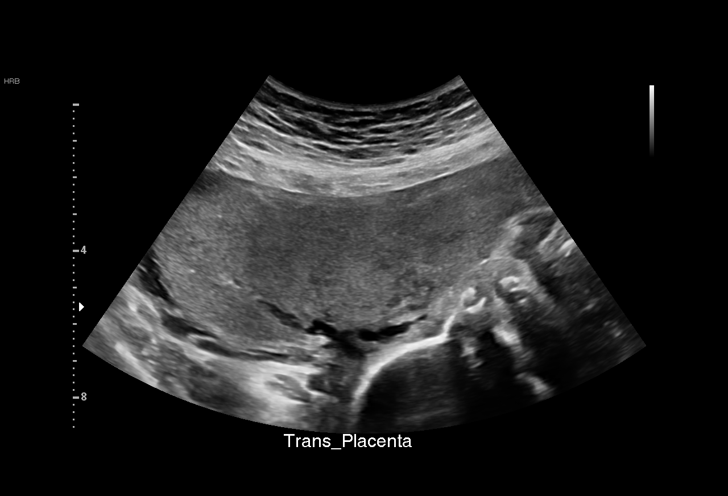
[im 14/24]
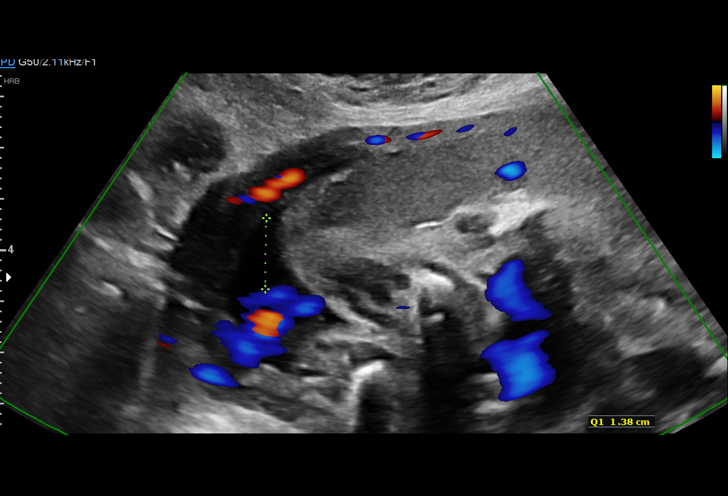
[im 16/24]
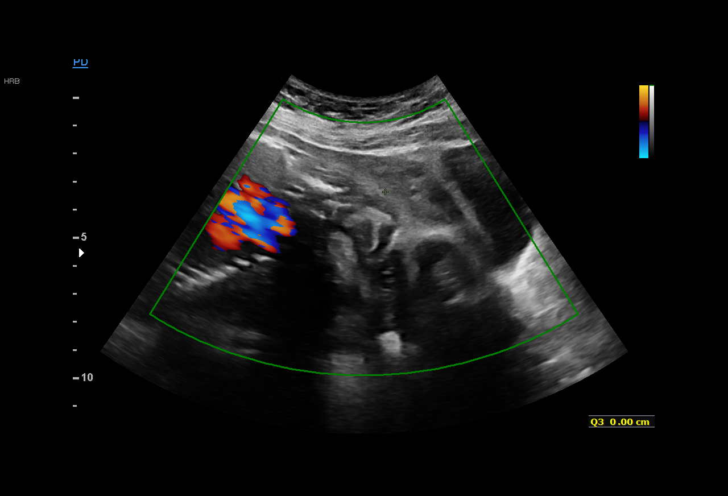
[im 17/24]
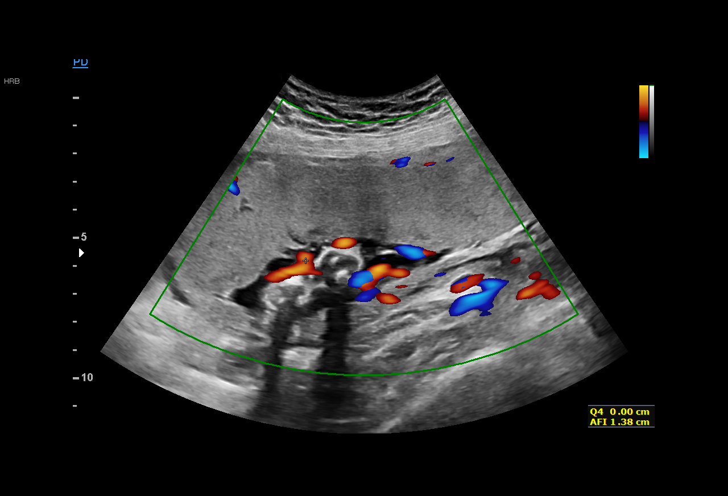
[im 19/24]
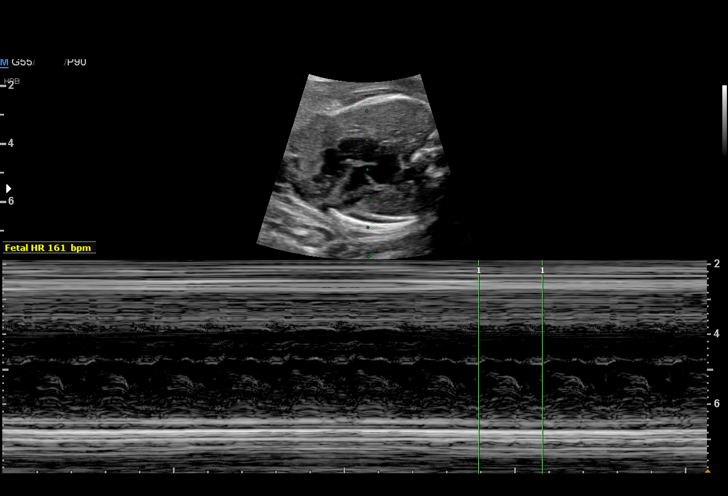
[im 21/24]
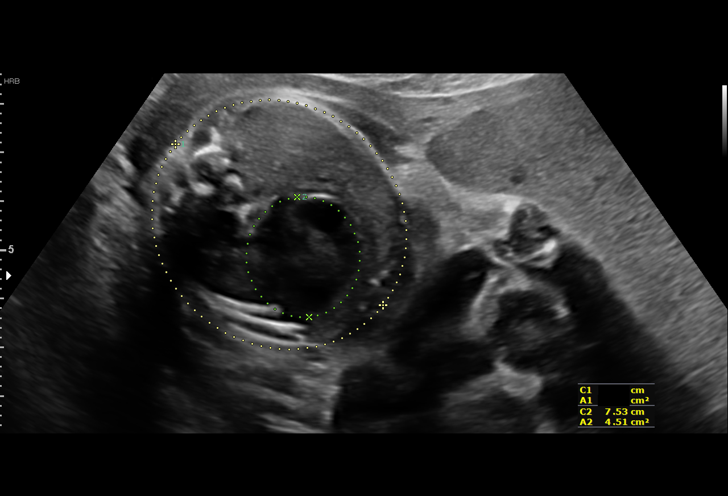
[im 22/24]
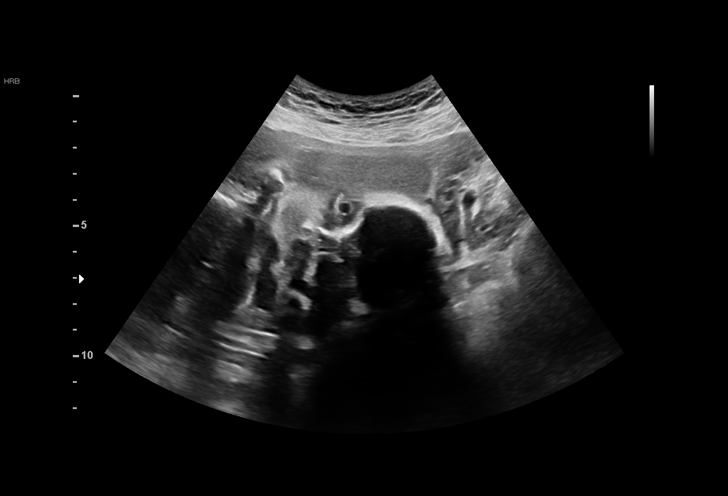
[im 24/24]
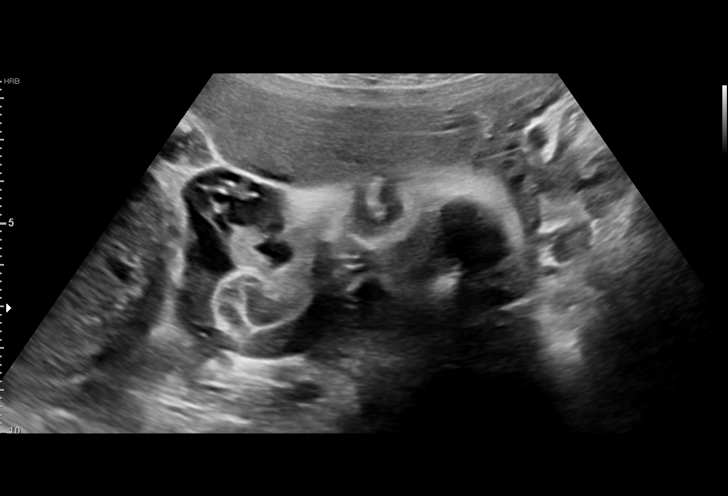

[15 of 24 positions shown; findings below may reference images not displayed]

Indications

26 weeks gestation of pregnancy
Premature rupture of membranes - leaking
fluid
OB History

Blood Type:            Height:  4'11"  Weight (lb):  156       BMI:
Gravidity:    11        Term:   8         SAB:   2
Living:       7
Fetal Evaluation

Num Of Fetuses:     1
Fetal Heart         146
Rate(bpm):
Cardiac Activity:   Observed
Presentation:       Cephalic
Placenta:           Anterior

Amniotic Fluid
AFI FV:      Oligohydramnios

AFI Sum(cm)     %Tile       Largest Pocket(cm)
1.38            < 3

RUQ(cm)       RLQ(cm)       LUQ(cm)        LLQ(cm)
1.38          0             0              0
Gestational Age

Best:          26w 6d     Det. By:  U/S  (05/14/18)          EDD:   10/09/18
Anatomy
Stomach:               Absence of fluid       Bladder:                Visualized small
filled stomach
amount of fluid
Impression

A limited ultrasound study was performed. Oligohydramnios
is seen again. Fetal activity is present. Mild cardiomegaly (no
effusion or hydrops) seen could be from the chest
compression.
Recommendations

Continue weekly ultrasound at the [facility].

## 2024-08-05 ENCOUNTER — Encounter (HOSPITAL_COMMUNITY): Payer: Self-pay

## 2024-08-05 ENCOUNTER — Other Ambulatory Visit: Payer: Self-pay

## 2024-08-05 ENCOUNTER — Emergency Department (HOSPITAL_COMMUNITY)

## 2024-08-05 ENCOUNTER — Inpatient Hospital Stay (HOSPITAL_COMMUNITY)
Admission: EM | Admit: 2024-08-05 | Discharge: 2024-08-07 | DRG: 872 | Disposition: A | Discharge status: Home / Self Care | Attending: Internal Medicine | Admitting: Internal Medicine

## 2024-08-05 DIAGNOSIS — Z79899 Other long term (current) drug therapy: Secondary | ICD-10-CM

## 2024-08-05 DIAGNOSIS — F1590 Other stimulant use, unspecified, uncomplicated: Secondary | ICD-10-CM | POA: Diagnosis present

## 2024-08-05 DIAGNOSIS — Z887 Allergy status to serum and vaccine status: Secondary | ICD-10-CM

## 2024-08-05 DIAGNOSIS — F909 Attention-deficit hyperactivity disorder, unspecified type: Secondary | ICD-10-CM | POA: Diagnosis present

## 2024-08-05 DIAGNOSIS — F1490 Cocaine use, unspecified, uncomplicated: Secondary | ICD-10-CM | POA: Diagnosis present

## 2024-08-05 DIAGNOSIS — F17210 Nicotine dependence, cigarettes, uncomplicated: Secondary | ICD-10-CM | POA: Diagnosis present

## 2024-08-05 DIAGNOSIS — N3000 Acute cystitis without hematuria: Secondary | ICD-10-CM

## 2024-08-05 DIAGNOSIS — F1290 Cannabis use, unspecified, uncomplicated: Secondary | ICD-10-CM | POA: Diagnosis present

## 2024-08-05 DIAGNOSIS — Z5320 Procedure and treatment not carried out because of patient's decision for unspecified reasons: Secondary | ICD-10-CM | POA: Diagnosis present

## 2024-08-05 DIAGNOSIS — E876 Hypokalemia: Secondary | ICD-10-CM | POA: Diagnosis present

## 2024-08-05 DIAGNOSIS — Z6835 Body mass index (BMI) 35.0-35.9, adult: Secondary | ICD-10-CM

## 2024-08-05 DIAGNOSIS — K529 Noninfective gastroenteritis and colitis, unspecified: Secondary | ICD-10-CM | POA: Diagnosis present

## 2024-08-05 DIAGNOSIS — Z91040 Latex allergy status: Secondary | ICD-10-CM

## 2024-08-05 DIAGNOSIS — F1910 Other psychoactive substance abuse, uncomplicated: Secondary | ICD-10-CM | POA: Diagnosis present

## 2024-08-05 DIAGNOSIS — N12 Tubulo-interstitial nephritis, not specified as acute or chronic: Principal | ICD-10-CM | POA: Diagnosis present

## 2024-08-05 DIAGNOSIS — B1920 Unspecified viral hepatitis C without hepatic coma: Secondary | ICD-10-CM | POA: Diagnosis present

## 2024-08-05 DIAGNOSIS — F1990 Other psychoactive substance use, unspecified, uncomplicated: Secondary | ICD-10-CM | POA: Diagnosis present

## 2024-08-05 DIAGNOSIS — K760 Fatty (change of) liver, not elsewhere classified: Secondary | ICD-10-CM | POA: Diagnosis present

## 2024-08-05 DIAGNOSIS — A419 Sepsis, unspecified organism: Principal | ICD-10-CM | POA: Diagnosis present

## 2024-08-05 DIAGNOSIS — R000 Tachycardia, unspecified: Secondary | ICD-10-CM | POA: Diagnosis present

## 2024-08-05 DIAGNOSIS — E66812 Obesity, class 2: Secondary | ICD-10-CM | POA: Diagnosis present

## 2024-08-05 LAB — COMPREHENSIVE METABOLIC PANEL WITH GFR
ALT: 69 U/L — ABNORMAL HIGH (ref 0–44)
AST: 40 U/L (ref 15–41)
Albumin: 3.8 g/dL (ref 3.5–5.0)
Alkaline Phosphatase: 75 U/L (ref 38–126)
Anion gap: 13 (ref 5–15)
BUN: 7 mg/dL (ref 6–20)
CO2: 19 mmol/L — ABNORMAL LOW (ref 22–32)
Calcium: 9.5 mg/dL (ref 8.9–10.3)
Chloride: 101 mmol/L (ref 98–111)
Creatinine, Ser: 0.71 mg/dL (ref 0.44–1.00)
GFR, Estimated: >60 mL/min (ref >60)
Glucose, Bld: 108 mg/dL — ABNORMAL HIGH (ref 70–99)
Potassium: 3.1 mmol/L — ABNORMAL LOW (ref 3.5–5.1)
Sodium: 133 mmol/L — ABNORMAL LOW (ref 135–145)
Total Bilirubin: 1.3 mg/dL — ABNORMAL HIGH (ref 0.0–1.2)
Total Protein: 8.1 g/dL (ref 6.5–8.1)

## 2024-08-05 LAB — CBC
HCT: 49.7 % — ABNORMAL HIGH (ref 36.0–46.0)
Hemoglobin: 16.7 g/dL — ABNORMAL HIGH (ref 12.0–15.0)
MCH: 30.9 pg (ref 26.0–34.0)
MCHC: 33.6 g/dL (ref 30.0–36.0)
MCV: 92 fL (ref 80.0–100.0)
Platelets: 179 K/uL (ref 150–400)
RBC: 5.4 MIL/uL — ABNORMAL HIGH (ref 3.87–5.11)
RDW: 13.7 % (ref 11.5–15.5)
WBC: 15.6 K/uL — ABNORMAL HIGH (ref 4.0–10.5)
nRBC: 0 % (ref 0.0–0.2)

## 2024-08-05 LAB — URINALYSIS, W/ REFLEX TO CULTURE (INFECTION SUSPECTED)
Bilirubin Urine: NEGATIVE
Glucose, UA: NEGATIVE mg/dL
Ketones, ur: 5 mg/dL — AB
Nitrite: NEGATIVE
Protein, ur: 100 mg/dL — AB
Specific Gravity, Urine: 1.033 — ABNORMAL HIGH (ref 1.005–1.030)
WBC, UA: >50 WBC/hpf (ref 0–5)
pH: 5 (ref 5.0–8.0)

## 2024-08-05 LAB — I-STAT CG4 LACTIC ACID, ED
Lactic Acid, Venous: 2.5 mmol/L (ref 0.5–1.9)
Lactic Acid, Venous: 2.6 mmol/L (ref 0.5–1.9)

## 2024-08-05 LAB — HCG, SERUM, QUALITATIVE: Preg, Serum: NEGATIVE

## 2024-08-05 LAB — I-STAT VENOUS BLOOD GAS, ED
Acid-base deficit: 3 mmol/L — ABNORMAL HIGH (ref 0.0–2.0)
Bicarbonate: 19.3 mmol/L — ABNORMAL LOW (ref 20.0–28.0)
Calcium, Ion: 0.94 mmol/L — ABNORMAL LOW (ref 1.15–1.40)
HCT: 50 % — ABNORMAL HIGH (ref 36.0–46.0)
Hemoglobin: 17 g/dL — ABNORMAL HIGH (ref 12.0–15.0)
O2 Saturation: 98 %
Potassium: 4.2 mmol/L (ref 3.5–5.1)
Sodium: 133 mmol/L — ABNORMAL LOW (ref 135–145)
TCO2: 20 mmol/L — ABNORMAL LOW (ref 22–32)
pCO2, Ven: 28.2 mmHg — ABNORMAL LOW (ref 44–60)
pH, Ven: 7.443 — ABNORMAL HIGH (ref 7.25–7.43)
pO2, Ven: 99 mmHg — ABNORMAL HIGH (ref 32–45)

## 2024-08-05 LAB — RAPID URINE DRUG SCREEN, HOSP PERFORMED
Amphetamines: POSITIVE — AB
Barbiturates: NOT DETECTED
Benzodiazepines: NOT DETECTED
Cocaine: POSITIVE — AB
Opiates: NOT DETECTED
Tetrahydrocannabinol: POSITIVE — AB

## 2024-08-05 LAB — MAGNESIUM: Magnesium: 1.6 mg/dL — ABNORMAL LOW (ref 1.7–2.4)

## 2024-08-05 LAB — PROTIME-INR
INR: 1.2 (ref 0.8–1.2)
Prothrombin Time: 15.8 s — ABNORMAL HIGH (ref 11.4–15.2)

## 2024-08-05 LAB — AMMONIA: Ammonia: 15 umol/L (ref 9–35)

## 2024-08-05 LAB — RESP PANEL BY RT-PCR (RSV, FLU A&B, COVID)  RVPGX2
Influenza A by PCR: NEGATIVE
Influenza B by PCR: NEGATIVE
Resp Syncytial Virus by PCR: NEGATIVE
SARS Coronavirus 2 by RT PCR: NEGATIVE

## 2024-08-05 LAB — LIPASE, BLOOD: Lipase: 21 U/L (ref 11–51)

## 2024-08-05 MED ORDER — ONDANSETRON HCL 4 MG/2ML IJ SOLN
4.0000 mg | Freq: Four times a day (QID) | INTRAMUSCULAR | Status: DC | PRN
Start: 2024-08-05 — End: 2024-08-07

## 2024-08-05 MED ORDER — LACTATED RINGERS IV BOLUS (SEPSIS)
1000.0000 mL | Freq: Once | INTRAVENOUS | Status: AC
Start: 2024-08-05 — End: 2024-08-05
  Administered 2024-08-05: 1000 mL via INTRAVENOUS

## 2024-08-05 MED ORDER — NICOTINE 21 MG/24HR TD PT24
21.0000 mg | MEDICATED_PATCH | Freq: Every day | TRANSDERMAL | Status: DC
  Administered 2024-08-06 (×2): 21 mg via TRANSDERMAL
  Filled 2024-08-05 (×2): qty 1

## 2024-08-05 MED ORDER — ACETAMINOPHEN 325 MG PO TABS: 650.0000 mg | ORAL_TABLET | Freq: Four times a day (QID) | ORAL | Status: DC | PRN

## 2024-08-05 MED ORDER — SODIUM CHLORIDE 0.9 % IV SOLN
2.0000 g | Freq: Once | INTRAVENOUS | Status: AC
  Administered 2024-08-05: 2 g via INTRAVENOUS
  Filled 2024-08-05: qty 12.5

## 2024-08-05 MED ORDER — LACTATED RINGERS IV BOLUS (SEPSIS)
1000.0000 mL | Freq: Once | INTRAVENOUS | Status: AC
  Administered 2024-08-05: 1000 mL via INTRAVENOUS

## 2024-08-05 MED ORDER — METRONIDAZOLE 500 MG/100ML IV SOLN
500.0000 mg | Freq: Once | INTRAVENOUS | Status: AC
  Administered 2024-08-05: 500 mg via INTRAVENOUS
  Filled 2024-08-05: qty 100

## 2024-08-05 MED ORDER — LACTATED RINGERS IV SOLN: INTRAVENOUS | Status: DC

## 2024-08-05 MED ORDER — LACTATED RINGERS IV BOLUS (SEPSIS)
250.0000 mL | Freq: Once | INTRAVENOUS | Status: AC
  Administered 2024-08-05: 250 mL via INTRAVENOUS

## 2024-08-05 MED ORDER — KETOROLAC TROMETHAMINE 15 MG/ML IJ SOLN
15.0000 mg | Freq: Four times a day (QID) | INTRAMUSCULAR | Status: DC | PRN
  Administered 2024-08-06: 15 mg via INTRAVENOUS
  Filled 2024-08-05: qty 1

## 2024-08-05 MED ORDER — ONDANSETRON HCL 4 MG PO TABS
4.0000 mg | ORAL_TABLET | Freq: Four times a day (QID) | ORAL | Status: DC | PRN
Start: 2024-08-05 — End: 2024-08-07

## 2024-08-05 MED ORDER — LACTATED RINGERS IV BOLUS
1000.0000 mL | Freq: Once | INTRAVENOUS | Status: AC
  Administered 2024-08-05: 1000 mL via INTRAVENOUS

## 2024-08-05 MED ORDER — ENOXAPARIN SODIUM 40 MG/0.4ML IJ SOSY
40.0000 mg | PREFILLED_SYRINGE | INTRAMUSCULAR | Status: DC
  Administered 2024-08-06: 40 mg via SUBCUTANEOUS
  Filled 2024-08-05 (×2): qty 0.4

## 2024-08-05 MED ORDER — VANCOMYCIN HCL IN DEXTROSE 1-5 GM/200ML-% IV SOLN
1000.0000 mg | Freq: Once | INTRAVENOUS | Status: AC
  Administered 2024-08-05: 1000 mg via INTRAVENOUS
  Filled 2024-08-05: qty 200

## 2024-08-05 MED ORDER — IOHEXOL 350 MG/ML SOLN
80.0000 mL | Freq: Once | INTRAVENOUS | Status: AC | PRN
  Administered 2024-08-05: 80 mL via INTRAVENOUS

## 2024-08-05 MED ORDER — ACETAMINOPHEN 650 MG RE SUPP
650.0000 mg | Freq: Four times a day (QID) | RECTAL | Status: DC | PRN
Start: 2024-08-05 — End: 2024-08-06

## 2024-08-05 MED ORDER — SODIUM CHLORIDE 0.9 % IV SOLN
2.0000 g | INTRAVENOUS | Status: DC
  Administered 2024-08-06 – 2024-08-07 (×2): 2 g via INTRAVENOUS
  Filled 2024-08-05 (×2): qty 20

## 2024-08-05 MED ORDER — ONDANSETRON HCL 4 MG/2ML IJ SOLN
4.0000 mg | Freq: Once | INTRAMUSCULAR | Status: AC
  Administered 2024-08-05: 4 mg via INTRAVENOUS
  Filled 2024-08-05: qty 2

## 2024-08-05 MED ORDER — SODIUM CHLORIDE 0.9% FLUSH
3.0000 mL | Freq: Two times a day (BID) | INTRAVENOUS | Status: DC
  Administered 2024-08-06: 3 mL via INTRAVENOUS

## 2024-08-05 MED ORDER — KETOROLAC TROMETHAMINE 30 MG/ML IJ SOLN
30.0000 mg | Freq: Once | INTRAMUSCULAR | Status: AC
  Administered 2024-08-05: 30 mg via INTRAVENOUS
  Filled 2024-08-05: qty 1

## 2024-08-05 NOTE — Hospital Course (Addendum)
 Jasmine Taylor is a 28 y.o. female with medical history significant for hepatitis C, polysubstance use who is admitted with sepsis due to enterocolitis.  CC: abd pain, Nausea, weakness, diarrhea HPI: Jasmine Taylor is a 28 y.o. female with medical history significant for hepatitis C, polysubstance use who presented to the ED for evaluation of abdominal pain.   Patient states that she developed new onset abdominal pain has been ongoing for the last 2 days.  Pain is mostly across her upper abdomen but sometimes involving lower abdomen as well.  She has had nausea but no significant emesis.  She has felt hot at home.  She says started having diarrhea while she was in the ED.  She does not recall anything in her diet which might have triggered her symptoms.   Patient reports a history of hepatitis C which has not been treated.  She states that she smokes half pack of cigarettes daily.  She admits to cocaine and methamphetamine use.  She also reports marijuana use.  She has a prior history of IV drug use but denies any use for several years.   Patient states she does not take any medications regularly.   ED Course  Labs/Imaging on admission: I have personally reviewed following labs and imaging studies.   Initial vitals showed BP 126/106, pulse 151, RR 17, temp 99.5 F, SpO2 99% on room air.   Labs show WBC 15.6, hemoglobin 16.7, platelets 179, sodium 133, potassium 3.1, bicarb 19, BUN 7, creatinine 0.71, serum glucose 108, lipase 21, ammonia 15, lactic acid 2.5.   Serum hCG negative.  Urinalysis showed negative nitrites, moderate leukocytes, 21-50 RBCs, >50 WBCs, many bacteria.  Blood cultures in process.  UDS positive for cocaine, amphetamines, THC.   SARS-CoV-2, influenza, RSV PCR negative.   CTA chest negative for PE.  Mild central bronchial thickening and wall thickening of the distal esophagus seen.  Prominent low right hilar node is likely reactive.   CT abdomen/pelvis with  contrast showed fluid-filled large and small bowel with areas of wall thickening typical of enterocolitis.  Hepatomegaly and hepatic steatosis seen.   Patient was given 3.25 liters LR, IV vancomycin , cefepime , and Flagyl , IV Toradol  30 mg.  Hospitalist service was consulted for admission.  Significant Events: Admitted 08/05/2024 for enterocolitis   Admission Labs: WBC 15.6, hemoglobin 16.7, platelets 179  sodium 133, potassium 3.1, bicarb 19, BUN 7, creatinine 0.71, serum glucose 108, lipase 21, ammonia 15, lactic acid 2.5.  Urinalysis showed negative nitrites, moderate leukocytes, 21-50 RBCs, >50 WBCs, many bacteria.  Blood cultures in process.  UDS positive for cocaine, amphetamines, THC.  SARS-CoV-2, influenza, RSV PCR negative   Admission Imaging Studies: CTA chest negative for PE. Mild central bronchial thickening and wall thickening of the distal esophagus seen. Prominent low right hilar node is likely reactive.  CT abdomen/pelvis with contrast showed fluid-filled large and small bowel with areas of wall thickening typical of enterocolitis. Hepatomegaly and hepatic steatosis seen.   Significant Labs:   Significant Imaging Studies:   Antibiotic Therapy: Anti-infectives (From admission, onward)    Start     Dose/Rate Route Frequency Ordered Stop   08/06/24 0400  cefTRIAXone  (ROCEPHIN ) 2 g in sodium chloride  0.9 % 100 mL IVPB        2 g 200 mL/hr over 30 Minutes Intravenous Every 24 hours 08/05/24 2328 08/13/24 0359   08/05/24 2015  ceFEPIme  (MAXIPIME ) 2 g in sodium chloride  0.9 % 100 mL IVPB  2 g 200 mL/hr over 30 Minutes Intravenous  Once 08/05/24 2008 08/05/24 2217   08/05/24 2015  metroNIDAZOLE  (FLAGYL ) IVPB 500 mg        500 mg 100 mL/hr over 60 Minutes Intravenous  Once 08/05/24 2008 08/05/24 2350   08/05/24 2015  vancomycin  (VANCOCIN ) IVPB 1000 mg/200 mL premix        1,000 mg 200 mL/hr over 60 Minutes Intravenous  Once 08/05/24 2008 08/05/24 2350        Procedures:   Consultants:

## 2024-08-05 NOTE — ED Notes (Signed)
 Patient transported to CT

## 2024-08-05 NOTE — ED Provider Notes (Signed)
 [facility] Provider Note   CSN: [id number] Arrival date & time: 08/05/24  1831     Patient presents with: Abdominal Pain   Jasmine Taylor is a 28 y.o. female.   HPI Patient reports that she has had intense nausea for about 3 days.  Not a lot of vomiting.  She reports she has had upper abdominal pain.  She indicates that both of her upper quadrants and lower thoracic chest are area of pain.  Patient reports she has felt somewhat short of breath.  She reports she has had diarrhea.  No documented fever that she is aware of.  Patient reports she has had some cough but not a lot.  Patient reports she is sexually active with 1 partner.  Denies pain burning urgency with urination.  Patient reports she used to inject IV drugs but has not done that for at least several years.  Patient reports she does smoke marijuana daily.  Denies alcohol use.  Does smoke cigarettes.    Prior to Admission medications   Medication Sig Start Date End Date Taking? Authorizing Provider  acetaminophen  (TYLENOL ) 500 MG tablet Take 500 mg by mouth every 6 (six) hours as needed for headache.    [provider]  ibuprofen  (ADVIL ,MOTRIN ) 600 MG tablet Take 1 tablet (600 mg total) by mouth every 6 (six) hours as needed. 12/27/18   Nivia Colon, PA-C  oxyCODONE  (OXY IR/ROXICODONE ) 5 MG immediate release tablet Take 1 tablet (5 mg total) by mouth every 6 (six) hours as needed (pain scale 4-7). 07/19/18   Nicholaus Burnard HERO, MD  penicillin  v potassium (VEETID) 500 MG tablet Take 1 tablet (500 mg total) by mouth 3 (three) times daily. 12/27/18   Nivia Colon, PA-C  Prenatal Vit-Fe Fumarate-FA (PRENATAL MULTIVITAMIN) TABS tablet Take 1 tablet by mouth daily at 12 noon.    [provider]    Allergies: Influenza a (h1n1) monovalent vaccine and Latex    Review of Systems  Updated Vital Signs BP (!) 143/91 (BP Location: Left Arm)   Pulse (!) 122   Temp 98.8 F (37.1  C) (Oral)   Resp (!) 21   SpO2 100%   Physical Exam Constitutional:      Comments: Patient is alert.  No respiratory distress.  Uncomfortable appearance but nontoxic.  HENT:     Head: Normocephalic and atraumatic.     Mouth/Throat:     Comments: Very poor dentition, mucous membranes pink moist Eyes:     Extraocular Movements: Extraocular movements intact.     Pupils: Pupils are equal, round, and reactive to light.  Cardiovascular:     Rate and Rhythm: Regular rhythm. Tachycardia present.  Pulmonary:     Effort: Pulmonary effort is normal.     Breath sounds: Normal breath sounds.  Abdominal:     Palpations: Abdomen is soft.     Comments: Patient has significant abdominal pain to palpation diffusely of the upper abdomen in the right and left quadrants and epigastrium.  Mild diffuse tenderness lower abdomen but less than upper.  Musculoskeletal:     Comments: No significant peripheral edema.  Calves are pliable nontender.  Skin:    General: Skin is warm and dry.  Neurological:     General: No focal deficit present.     Mental Status: She is oriented to person, place, and time.     Motor: No weakness.     Coordination: Coordination normal.     (  all labs ordered are listed, but only abnormal results are displayed) Labs Reviewed  COMPREHENSIVE METABOLIC PANEL WITH GFR - Abnormal; Notable for the following components:      Result Value   Sodium 133 (*)    Potassium 3.1 (*)    CO2 19 (*)    Glucose, Bld 108 (*)    ALT 69 (*)    Total Bilirubin 1.3 (*)    All other components within normal limits  CBC - Abnormal; Notable for the following components:   WBC 15.6 (*)    RBC 5.40 (*)    Hemoglobin 16.7 (*)    HCT 49.7 (*)    All other components within normal limits  RAPID URINE DRUG SCREEN, HOSP PERFORMED - Abnormal; Notable for the following components:   Cocaine POSITIVE (*)    Amphetamines POSITIVE (*)    Tetrahydrocannabinol POSITIVE (*)    All other components within  normal limits  PROTIME-INR - Abnormal; Notable for the following components:   Prothrombin Time 15.8 (*)    All other components within normal limits  URINALYSIS, W/ REFLEX TO CULTURE (INFECTION SUSPECTED) - Abnormal; Notable for the following components:   Color, Urine AMBER (*)    APPearance CLOUDY (*)    Specific Gravity, Urine 1.033 (*)    Hgb urine dipstick LARGE (*)    Ketones, ur 5 (*)    Protein, ur 100 (*)    Leukocytes,Ua MODERATE (*)    Bacteria, UA MANY (*)    All other components within normal limits  I-STAT CG4 LACTIC ACID, ED - Abnormal; Notable for the following components:   Lactic Acid, Venous 2.5 (*)    All other components within normal limits  I-STAT VENOUS BLOOD GAS, ED - Abnormal; Notable for the following components:   pH, Ven 7.443 (*)    pCO2, Ven 28.2 (*)    pO2, Ven 99 (*)    Bicarbonate 19.3 (*)    TCO2 20 (*)    Acid-base deficit 3.0 (*)    Sodium 133 (*)    Calcium , Ion 0.94 (*)    HCT 50.0 (*)    Hemoglobin 17.0 (*)    All other components within normal limits  RESP PANEL BY RT-PCR (RSV, FLU A&B, COVID)  RVPGX2  CULTURE, BLOOD (ROUTINE X 2)  CULTURE, BLOOD (ROUTINE X 2)  LIPASE, BLOOD  HCG, SERUM, QUALITATIVE  AMMONIA  I-STAT CG4 LACTIC ACID, ED    EKG: EKG Interpretation Date/Time:  Friday August 05 2024 21:08:22 EDT Ventricular Rate:  131 PR Interval:  125 QRS Duration:  77 QT Interval:  305 QTC Calculation: 451 R Axis:   80  Text Interpretation: Sinus tachycardia Consider right atrial enlargement Borderline repolarization abnormality rate slower otherwise no  sig change Confirmed by Armenta Canning [phone]) on 08/05/2024 9:17:41 PM  Radiology: CT ABDOMEN PELVIS W CONTRAST Result Date: 08/05/2024 CLINICAL DATA:  Upper abdominal pain.  Nausea. EXAM: CT ABDOMEN AND PELVIS WITH CONTRAST TECHNIQUE: Multidetector CT imaging of the abdomen and pelvis was performed using the standard protocol following bolus administration of intravenous  contrast. RADIATION DOSE REDUCTION: This exam was performed according to the departmental dose-optimization program which includes automated exposure control, adjustment of the mA and/or kV according to patient size and/or use of iterative reconstruction technique. CONTRAST:  80mL OMNIPAQUE  IOHEXOL  350 MG/ML SOLN COMPARISON:  None Available. FINDINGS: Lower chest: Assessed on concurrent chest CT, reported separately. Hepatobiliary: Mild diffuse hepatic steatosis. The liver is enlarged spanning 19.1 cm cranial caudal.  No focal liver abnormality. Gallbladder physiologically distended, no calcified stone. No biliary dilatation. Pancreas: No ductal dilatation or inflammation. Spleen: Normal in size without focal abnormality. Adrenals/Urinary Tract: Normal adrenal glands. No hydronephrosis or perinephric edema. Homogeneous renal enhancement with symmetric excretion on delayed phase imaging. Urinary bladder is partially distended without wall thickening. Stomach/Bowel: Fluid-filled small bowel in the mid lower abdomen with areas of wall thickening and mesenteric edema. No bowel pneumatosis. No obstruction. Diffuse liquid stool throughout the colon. Occasional areas of colonic wall thickening particularly the sigmoid. Normal appendix. Physiologically distended stomach without gastric wall thickening. Vascular/Lymphatic: Normal caliber abdominal aorta. Patent portal vein. No portal venous or mesenteric gas. Retroaortic left renal vein. No abdominopelvic adenopathy. Reproductive: Low density in the cervix, nonspecific. No adnexal mass. Other: Trace pelvic free fluid. No focal fluid collection. No free intra-abdominal air. Musculoskeletal: There are no acute or suspicious osseous abnormalities. IMPRESSION: 1. Fluid-filled large and small bowel with areas of wall thickening typical of enterocolitis. This may be infectious or inflammatory. 2. Hepatomegaly and hepatic steatosis. 3. Low density in the cervix, nonspecific.  Recommend correlation with physical exam. Electronically Signed   By: Andrea Gasman M.D.   On: 08/05/2024 22:34   CT Angio Chest PE W and/or Wo Contrast Result Date: 08/05/2024 CLINICAL DATA:  Provided history: Pulmonary embolism (PE) suspected, high prob Upper abdominal pain.  Nausea.  EXAM: CT ANGIOGRAPHY CHEST WITH CONTRAST TECHNIQUE: Multidetector CT imaging of the chest was performed using the standard protocol during bolus administration of intravenous contrast. Multiplanar CT image reconstructions and MIPs were obtained to evaluate the vascular anatomy. RADIATION DOSE REDUCTION: This exam was performed according to the departmental dose-optimization program which includes automated exposure control, adjustment of the mA and/or kV according to patient size and/or use of iterative reconstruction technique. CONTRAST:  80mL OMNIPAQUE  IOHEXOL  350 MG/ML SOLN COMPARISON:  Chest radiograph earlier today FINDINGS: Cardiovascular: There are no filling defects within the pulmonary arteries to suggest pulmonary embolus. Thoracic aorta is normal in caliber, no acute aortic findings. The heart is normal in size. No pericardial effusion. Mediastinum/Nodes: Wall thickening of the distal esophagus. No enlarged mediastinal nodes. Prominent low right hilar node measures 11 mm short axis. No thyroid nodule. Lungs/Pleura: Mild central bronchial thickening. No focal airspace disease. No pleural effusion. No features of pulmonary edema. No suspicious pulmonary nodule. Upper Abdomen: Assessed on concurrent abdominopelvic CT, reported separately. Musculoskeletal: There are no acute or suspicious osseous abnormalities. Review of the MIP images confirms the above findings. IMPRESSION: 1. No pulmonary embolus. 2. Mild central bronchial thickening, can be seen with bronchitis or reactive airways disease. 3. Wall thickening of the distal esophagus, query reflux or esophagitis. 4. Prominent low right hilar node is likely reactive.  Electronically Signed   By: Andrea Gasman M.D.   On: 08/05/2024 22:29   DG Chest Port 1 View Result Date: 08/05/2024 CLINICAL DATA:  Possible sepsis EXAM: PORTABLE CHEST 1 VIEW COMPARISON:  09/09/2016 FINDINGS: The heart size and mediastinal contours are within normal limits. Both lungs are clear. The visualized skeletal structures are unremarkable. Hazy right lung base probably due to overlying soft tissue artifact. IMPRESSION: No active disease. Electronically Signed   By: Luke Bun M.D.   On: 08/05/2024 20:50     Procedures  CRITICAL CARE Performed by: Ludivina Shines   Total critical care time: 30 minutes  Critical care time was exclusive of separately billable procedures and treating other patients.  Critical care was necessary to treat or prevent imminent or  life-threatening deterioration.  Critical care was time spent personally by me on the following activities: development of treatment plan with patient and/or surrogate as well as nursing, discussions with consultants, evaluation of patient's response to treatment, examination of patient, obtaining history from patient or surrogate, ordering and performing treatments and interventions, ordering and review of laboratory studies, ordering and review of radiographic studies, pulse oximetry and re-evaluation of patient's condition.  Medications Ordered in the ED  lactated ringers  infusion ( Intravenous New Bag/Given 08/05/24 2102)  metroNIDAZOLE  (FLAGYL ) IVPB 500 mg (500 mg Intravenous New Bag/Given 08/05/24 2220)  vancomycin  (VANCOCIN ) IVPB 1000 mg/200 mL premix (1,000 mg Intravenous New Bag/Given 08/05/24 2222)  lactated ringers  bolus 1,000 mL (0 mLs Intravenous Stopped 08/05/24 2101)  lactated ringers  bolus 1,000 mL (1,000 mLs Intravenous New Bag/Given 08/05/24 2104)    And  lactated ringers  bolus 1,000 mL (1,000 mLs Intravenous New Bag/Given 08/05/24 2105)    And  lactated ringers  bolus 250 mL (250 mLs Intravenous New Bag/Given  08/05/24 2255)  ceFEPIme  (MAXIPIME ) 2 g in sodium chloride  0.9 % 100 mL IVPB (0 g Intravenous Stopped 08/05/24 2217)  iohexol  (OMNIPAQUE ) 350 MG/ML injection 80 mL (80 mLs Intravenous Contrast Given 08/05/24 2153)  ketorolac  (TORADOL ) 30 MG/ML injection 30 mg (30 mg Intravenous Given 08/05/24 2252)  ondansetron  (ZOFRAN ) injection 4 mg (4 mg Intravenous Given 08/05/24 2252)                                    Medical Decision Making Amount and/or Complexity of Data Reviewed Labs: ordered. Radiology: ordered.  Risk Prescription drug management. Decision regarding hospitalization.   Patient presents as outlined.  She has significant tachycardia on arrival with heart rates to 140s to 150s.  Patient does not have hypotension.  Temperature is 99.5.  Patient describes significant upper abdominal pain bilaterally and shortness of breath.  Differential diagnosis includes pyelonephritis\pneumonia\PE\cholecystitis.  Will proceed with broad diagnostic workup.  White count 15.6 H&H 16 and 49 platelets 179 lipase 21 GFR greater than 60 i-STAT lactic 2.5 urinalysis 21-50 RBC greater than 50 WBC leuk esterase positive.  Sepsis protocol initiated.  At this time was positive urinalysis and significant upper abdominal pain bilaterally suspect pyelonephritis.  Awaiting CT results.  Consult: Dr. Tobie for admission.  CT suggest enterocolitis.  Patient's urinalysis is grossly positive.  Given patient's pain and positive urine strong consideration for pyelonephritis.  Patient is responding to fluid administration heart rate has come down to 120s.  Patient is alert without respiratory distress.  She will need admission for ongoing treatment.     Final diagnoses:  Pyelonephritis  Sepsis, due to unspecified organism, unspecified whether acute organ dysfunction present [facility])  Polysubstance abuse [facility])    ED Discharge Orders     None          Armenta Canning, MD 08/05/24 2302

## 2024-08-05 NOTE — Progress Notes (Signed)
 Elink monitoring for the code sepsis protocol.

## 2024-08-05 NOTE — ED Notes (Signed)
 3E notified of patient being transported to the floor.

## 2024-08-05 NOTE — ED Notes (Addendum)
 Unable to obtain 2nd lactic acid....KM  I will have another phleb try.

## 2024-08-05 NOTE — ED Triage Notes (Signed)
 Pt came in via [facility] from home d/t abd pain the past 2 days. Has had some nausea & feeling weak. Also reports having a HA.

## 2024-08-05 NOTE — H&P (Signed)
 History and Physical    Jasmine Taylor FMW:[id number] DOB: 14-May-1996 DOA: 08/05/2024  PCP: Pcp, No  Patient coming from: Home  I have personally briefly reviewed patient's old medical records in [facility]  Chief Complaint: Abdominal pain  HPI: Jasmine Taylor is a 28 y.o. female with medical history significant for hepatitis C, polysubstance use who presented to the ED for evaluation of abdominal pain.  Patient states that she developed new onset abdominal pain has been ongoing for the last 2 days.  Pain is mostly across her upper abdomen but sometimes involving lower abdomen as well.  She has had nausea but no significant emesis.  She has felt hot at home.  She says started having diarrhea while she was in the ED.  She does not recall anything in her diet which might have triggered her symptoms.  Patient reports a history of hepatitis C which has not been treated.  She states that she smokes half pack of cigarettes daily.  She admits to cocaine and methamphetamine use.  She also reports marijuana use.  She has a prior history of IV drug use but denies any use for several years.  Patient states she does not take any medications regularly.  ED Course  Labs/Imaging on admission: I have personally reviewed following labs and imaging studies.  Initial vitals showed BP 126/106, pulse 151, RR 17, temp 99.5 F, SpO2 99% on room air.  Labs show WBC 15.6, hemoglobin 16.7, platelets 179, sodium 133, potassium 3.1, bicarb 19, BUN 7, creatinine 0.71, serum glucose 108, lipase 21, ammonia 15, lactic acid 2.5.  Serum hCG negative.  Urinalysis showed negative nitrites, moderate leukocytes, 21-50 RBCs, >50 WBCs, many bacteria.  Blood cultures in process.  UDS positive for cocaine, amphetamines, THC.  SARS-CoV-2, influenza, RSV PCR negative.  CTA chest negative for PE.  Mild central bronchial thickening and wall thickening of the distal esophagus seen.  Prominent low right hilar  node is likely reactive.  CT abdomen/pelvis with contrast showed fluid-filled large and small bowel with areas of wall thickening typical of enterocolitis.  Hepatomegaly and hepatic steatosis seen.  Patient was given 3.25 liters LR, IV vancomycin , cefepime , and Flagyl , IV Toradol  30 mg.  Hospitalist service was consulted for admission.  Review of Systems: All systems reviewed and are negative except as documented in history of present illness above.   Past Medical History:  Diagnosis Date   ADHD (attention deficit hyperactivity disorder)    Anemia    Anxiety    Bipolar disorder (HCC)    Depression    Hepatitis C    Pregnancy induced hypertension     Past Surgical History:  Procedure Laterality Date   CESAREAN SECTION     CESAREAN SECTION N/A 06/28/2017   Procedure: CESAREAN SECTION;  Surgeon: Corene Coy, MD;  Location: [facility] BIRTHING SUITES;  Service: Obstetrics;  Laterality: N/A;   CESAREAN SECTION N/A 07/16/2018   Procedure: CESAREAN SECTION;  Surgeon: Fredirick Glenys RAMAN, MD;  Location: [facility] BIRTHING SUITES;  Service: Obstetrics;  Laterality: N/A;    Social History: She states that she smokes half pack of cigarettes daily.  She admits to cocaine and methamphetamine use.  She also reports marijuana use.  She has a prior history of IV drug use but denies any use for several years.  Allergies  Allergen Reactions   Influenza A (H1n1) Monovalent Vaccine Swelling   Latex Itching and Rash    History reviewed. No pertinent family history.   Prior to  Admission medications   Medication Sig Start Date End Date Taking? Authorizing Provider  acetaminophen  (TYLENOL ) 500 MG tablet Take 500 mg by mouth every 6 (six) hours as needed for headache.    [provider]  ibuprofen  (ADVIL ,MOTRIN ) 600 MG tablet Take 1 tablet (600 mg total) by mouth every 6 (six) hours as needed. 12/27/18   Nivia Colon, PA-C  oxyCODONE  (OXY IR/ROXICODONE ) 5 MG immediate release tablet Take 1 tablet (5  mg total) by mouth every 6 (six) hours as needed (pain scale 4-7). 07/19/18   Nicholaus Burnard HERO, MD  penicillin  v potassium (VEETID) 500 MG tablet Take 1 tablet (500 mg total) by mouth 3 (three) times daily. 12/27/18   Nivia Colon, PA-C  Prenatal Vit-Fe Fumarate-FA (PRENATAL MULTIVITAMIN) TABS tablet Take 1 tablet by mouth daily at 12 noon.    [provider]    Physical Exam: Vitals:   08/05/24 2015 08/05/24 2030 08/05/24 2130 08/05/24 2256  BP: (!) 124/90 (!) 140/93 (!) 141/91 (!) 143/91  Pulse: (!) 137 (!) 130 (!) 127 (!) 122  Resp: (!) 38 20  (!) 21  Temp:    98.8 F (37.1 C)  TempSrc:    Oral  SpO2: 96% 99% 100% 100%   Constitutional: Resting supine in bed, NAD, calm, comfortable Eyes: EOMI, lids and conjunctivae normal ENMT: Mucous membranes are dry. Posterior pharynx clear of any exudate or lesions.Normal dentition.  Neck: normal, supple, no masses. Respiratory: clear to auscultation bilaterally, no wheezing, no crackles. Normal respiratory effort. No accessory muscle use.  Cardiovascular: Tachycardic, no murmurs / rubs / gallops. No extremity edema. 2+ pedal pulses. Abdomen: Generalized tenderness, no masses palpated. Musculoskeletal: no clubbing / cyanosis. No joint deformity upper and lower extremities. Good ROM, no contractures. Normal muscle tone.  Skin: no rashes, lesions, ulcers. No induration Neurologic: Sensation intact. Strength 5/5 in all 4.  Psychiatric: Normal judgment and insight. Alert and oriented x 3. Normal mood.   EKG: Personally reviewed. Sinus tachycardia rate 153, no prior for comparison.  Repeat EKG showed sinus tachycardia rate 131.  Assessment/Plan Principal Problem:   Sepsis (HCC) Active Problems:   Enterocolitis   Polysubstance use disorder   Hypokalemia   Jasmine Taylor is a 28 y.o. female with medical history significant for hepatitis C, polysubstance use who is admitted with sepsis due to enterocolitis.  Assessment and  Plan: Sepsis due to enterocolitis/UTI: Patient presenting with tachycardia, leukocytosis, abdominal pain and diarrhea.  CT imaging consistent with enterocolitis.  UA also grossly abnormal.  Patient received broad-spectrum antibiotics in the ED. - Narrow antibiotics to IV ceftriaxone - Follow blood and urine cultures - Obtain C. difficile and GI pathogen panels - Bowel rest, continue IV fluid hydration overnight  Hypokalemia/hypomagnesemia: Supplement with IV magnesium  followed by IV potassium.  Polysubstance use: Patient reports tobacco, cocaine, methamphetamine, and marijuana use.  Nicotine  patch provided.   DVT prophylaxis: enoxaparin  (LOVENOX ) injection 40 mg Start: 08/05/24 2330 Code Status: Full code Family Communication: Discussed with patient, she has discussed with family Disposition Plan: From home, dispo pending clinical progress Consults called: None Severity of Illness: The appropriate patient status for this patient is OBSERVATION. Observation status is judged to be reasonable and necessary in order to provide the required intensity of service to ensure the patient's safety. The patient's presenting symptoms, physical exam findings, and initial radiographic and laboratory data in the context of their medical condition is felt to place them at decreased risk for further clinical deterioration. Furthermore, it is anticipated  that the patient will be medically stable for discharge from the hospital within 2 midnights of admission.   Jorie Blanch MD [facility]  If 7PM-7AM, please contact night-coverage [email or web address]  08/06/2024, 12:01 AM

## 2024-08-05 NOTE — ED Notes (Signed)
 Assumed care of patient at this time

## 2024-08-05 NOTE — ED Notes (Signed)
 Attempted to obtain 2 IV site, unsuccessful. Requested for fellow co worker to attempt to obtain IV acces with US .

## 2024-08-05 NOTE — ED Notes (Signed)
 Socks given to pt; pt ambulatory to RR.

## 2024-08-06 ENCOUNTER — Encounter (HOSPITAL_COMMUNITY): Payer: Self-pay | Admitting: Internal Medicine

## 2024-08-06 DIAGNOSIS — K529 Noninfective gastroenteritis and colitis, unspecified: Secondary | ICD-10-CM

## 2024-08-06 DIAGNOSIS — N3000 Acute cystitis without hematuria: Secondary | ICD-10-CM

## 2024-08-06 DIAGNOSIS — E66812 Obesity, class 2: Secondary | ICD-10-CM | POA: Diagnosis present

## 2024-08-06 DIAGNOSIS — F1990 Other psychoactive substance use, unspecified, uncomplicated: Secondary | ICD-10-CM | POA: Diagnosis not present

## 2024-08-06 DIAGNOSIS — N12 Tubulo-interstitial nephritis, not specified as acute or chronic: Secondary | ICD-10-CM | POA: Diagnosis present

## 2024-08-06 DIAGNOSIS — Z5320 Procedure and treatment not carried out because of patient's decision for unspecified reasons: Secondary | ICD-10-CM | POA: Diagnosis present

## 2024-08-06 DIAGNOSIS — K760 Fatty (change of) liver, not elsewhere classified: Secondary | ICD-10-CM | POA: Diagnosis present

## 2024-08-06 DIAGNOSIS — F1910 Other psychoactive substance abuse, uncomplicated: Secondary | ICD-10-CM | POA: Diagnosis present

## 2024-08-06 DIAGNOSIS — F1290 Cannabis use, unspecified, uncomplicated: Secondary | ICD-10-CM | POA: Diagnosis present

## 2024-08-06 DIAGNOSIS — A419 Sepsis, unspecified organism: Secondary | ICD-10-CM

## 2024-08-06 DIAGNOSIS — F1490 Cocaine use, unspecified, uncomplicated: Secondary | ICD-10-CM | POA: Diagnosis present

## 2024-08-06 DIAGNOSIS — Z91040 Latex allergy status: Secondary | ICD-10-CM | POA: Diagnosis not present

## 2024-08-06 DIAGNOSIS — R000 Tachycardia, unspecified: Secondary | ICD-10-CM | POA: Diagnosis present

## 2024-08-06 DIAGNOSIS — B1920 Unspecified viral hepatitis C without hepatic coma: Secondary | ICD-10-CM | POA: Diagnosis present

## 2024-08-06 DIAGNOSIS — E876 Hypokalemia: Secondary | ICD-10-CM

## 2024-08-06 DIAGNOSIS — F17210 Nicotine dependence, cigarettes, uncomplicated: Secondary | ICD-10-CM | POA: Diagnosis present

## 2024-08-06 DIAGNOSIS — F1590 Other stimulant use, unspecified, uncomplicated: Secondary | ICD-10-CM | POA: Diagnosis present

## 2024-08-06 DIAGNOSIS — Z887 Allergy status to serum and vaccine status: Secondary | ICD-10-CM | POA: Diagnosis not present

## 2024-08-06 DIAGNOSIS — Z79899 Other long term (current) drug therapy: Secondary | ICD-10-CM | POA: Diagnosis not present

## 2024-08-06 DIAGNOSIS — F909 Attention-deficit hyperactivity disorder, unspecified type: Secondary | ICD-10-CM | POA: Diagnosis present

## 2024-08-06 DIAGNOSIS — Z6835 Body mass index (BMI) 35.0-35.9, adult: Secondary | ICD-10-CM | POA: Diagnosis not present

## 2024-08-06 LAB — GASTROINTESTINAL PANEL BY PCR, STOOL (REPLACES STOOL CULTURE)

## 2024-08-06 LAB — BLOOD CULTURE ID PANEL (REFLEXED) - BCID2

## 2024-08-06 LAB — COMPREHENSIVE METABOLIC PANEL WITH GFR
ALT: 48 U/L — ABNORMAL HIGH (ref 0–44)
AST: 27 U/L (ref 15–41)
Albumin: 2.8 g/dL — ABNORMAL LOW (ref 3.5–5.0)
Alkaline Phosphatase: 62 U/L (ref 38–126)
Anion gap: 9 (ref 5–15)
BUN: 5 mg/dL — ABNORMAL LOW (ref 6–20)
CO2: 20 mmol/L — ABNORMAL LOW (ref 22–32)
Calcium: 8.7 mg/dL — ABNORMAL LOW (ref 8.9–10.3)
Chloride: 106 mmol/L (ref 98–111)
Creatinine, Ser: 0.63 mg/dL (ref 0.44–1.00)
GFR, Estimated: >60 mL/min (ref >60)
Glucose, Bld: 87 mg/dL (ref 70–99)
Potassium: 3.6 mmol/L (ref 3.5–5.1)
Sodium: 135 mmol/L (ref 135–145)
Total Bilirubin: 0.9 mg/dL (ref 0.0–1.2)
Total Protein: 6.2 g/dL — ABNORMAL LOW (ref 6.5–8.1)

## 2024-08-06 LAB — CBC
HCT: 40.8 % (ref 36.0–46.0)
Hemoglobin: 13.9 g/dL (ref 12.0–15.0)
MCH: 31 pg (ref 26.0–34.0)
MCHC: 34.1 g/dL (ref 30.0–36.0)
MCV: 91.1 fL (ref 80.0–100.0)
Platelets: 162 K/uL (ref 150–400)
RBC: 4.48 MIL/uL (ref 3.87–5.11)
RDW: 13.6 % (ref 11.5–15.5)
WBC: 14.3 K/uL — ABNORMAL HIGH (ref 4.0–10.5)
nRBC: 0 % (ref 0.0–0.2)

## 2024-08-06 LAB — C DIFFICILE QUICK SCREEN W PCR REFLEX
C Diff antigen: NEGATIVE
C Diff interpretation: NOT DETECTED
C Diff toxin: NEGATIVE

## 2024-08-06 LAB — HIV ANTIBODY (ROUTINE TESTING W REFLEX): HIV Screen 4th Generation wRfx: NONREACTIVE

## 2024-08-06 MED ORDER — ACETAMINOPHEN 650 MG RE SUPP: 650.0000 mg | Freq: Four times a day (QID) | RECTAL | Status: DC | PRN

## 2024-08-06 MED ORDER — MELATONIN 5 MG PO TABS: 10.0000 mg | ORAL_TABLET | Freq: Every evening | ORAL | Status: DC | PRN

## 2024-08-06 MED ORDER — MAGNESIUM SULFATE 2 GM/50ML IV SOLN
2.0000 g | Freq: Once | INTRAVENOUS | Status: AC
  Administered 2024-08-06: 2 g via INTRAVENOUS
  Filled 2024-08-06: qty 50

## 2024-08-06 MED ORDER — ACETAMINOPHEN 500 MG PO TABS
1000.0000 mg | ORAL_TABLET | Freq: Four times a day (QID) | ORAL | Status: DC | PRN
  Administered 2024-08-06 – 2024-08-07 (×2): 1000 mg via ORAL
  Filled 2024-08-06 (×2): qty 2

## 2024-08-06 MED ORDER — POTASSIUM CHLORIDE 10 MEQ/100ML IV SOLN
10.0000 meq | INTRAVENOUS | Status: AC
  Administered 2024-08-06 (×5): 10 meq via INTRAVENOUS
  Filled 2024-08-06 (×5): qty 100

## 2024-08-06 NOTE — Assessment & Plan Note (Signed)
 08-06-2024 present on admission. Met sepsis criteria with WBC 15.6, lactic acid 2.5, enterocolitis seen on CT abd/pelvis

## 2024-08-06 NOTE — Progress Notes (Signed)
 PHARMACY - PHYSICIAN COMMUNICATION CRITICAL VALUE ALERT - BLOOD CULTURE IDENTIFICATION (BCID)  Jasmine Taylor is an 28 y.o. female who presented to [facility] on 08/05/2024 with a chief complaint of abdominal pain.  Assessment:  1/3 bottles growing staph epi, no resistance detected.  Could be contaminant but would be covered by Ceftriaxone  that patient is currently receiving for UTI.  Name of physician (or Provider) Contacted: Dr. Camellia Door  Current antibiotics: Ceftriaxone  2gm IV q24h - empiric for UTI  Changes to prescribed antibiotics recommended:  Patient is on recommended antibiotics - No changes needed Will follow-up blood cultures and sensitivities.  Follow-up urine cx. Could narrow to Ancef  2gm IV q8h if desired.   Results for orders placed or performed during the hospital encounter of 08/05/24  Blood Culture ID Panel (Reflexed) (Collected: 08/05/2024  8:48 PM)  Result Value Ref Range   Enterococcus faecalis NOT DETECTED NOT DETECTED   Enterococcus Faecium NOT DETECTED NOT DETECTED   Listeria monocytogenes NOT DETECTED NOT DETECTED   Staphylococcus species DETECTED (A) NOT DETECTED   Staphylococcus aureus (BCID) NOT DETECTED NOT DETECTED   Staphylococcus epidermidis DETECTED (A) NOT DETECTED   Staphylococcus lugdunensis NOT DETECTED NOT DETECTED   Streptococcus species NOT DETECTED NOT DETECTED   Streptococcus agalactiae NOT DETECTED NOT DETECTED   Streptococcus pneumoniae NOT DETECTED NOT DETECTED   Streptococcus pyogenes NOT DETECTED NOT DETECTED   A.calcoaceticus-baumannii NOT DETECTED NOT DETECTED   Bacteroides fragilis NOT DETECTED NOT DETECTED   Enterobacterales NOT DETECTED NOT DETECTED   Enterobacter cloacae complex NOT DETECTED NOT DETECTED   Escherichia coli NOT DETECTED NOT DETECTED   Klebsiella aerogenes NOT DETECTED NOT DETECTED   Klebsiella oxytoca NOT DETECTED NOT DETECTED   Klebsiella pneumoniae NOT DETECTED NOT DETECTED   Proteus species NOT  DETECTED NOT DETECTED   Salmonella species NOT DETECTED NOT DETECTED   Serratia marcescens NOT DETECTED NOT DETECTED   Haemophilus influenzae NOT DETECTED NOT DETECTED   Neisseria meningitidis NOT DETECTED NOT DETECTED   Pseudomonas aeruginosa NOT DETECTED NOT DETECTED   Stenotrophomonas maltophilia NOT DETECTED NOT DETECTED   Candida albicans NOT DETECTED NOT DETECTED   Candida auris NOT DETECTED NOT DETECTED   Candida glabrata NOT DETECTED NOT DETECTED   Candida krusei NOT DETECTED NOT DETECTED   Candida parapsilosis NOT DETECTED NOT DETECTED   Candida tropicalis NOT DETECTED NOT DETECTED   Cryptococcus neoformans/gattii NOT DETECTED NOT DETECTED   Methicillin resistance mecA/C NOT DETECTED NOT DETECTED    [name], [name] 08/06/2024  4:59 PM

## 2024-08-06 NOTE — Subjective & Objective (Addendum)
 Pt seen and examined. Was hungry and asking for diet. Diarrhea improved. C. Diff negative as well as GI diarrhea panel. Eating and drinking soft diet without difficulty. No further N/V.  On 1 out of 4 bottle showing GPC. On IV rocephin  for UTI. Will await final ID on GPC.

## 2024-08-06 NOTE — Progress Notes (Signed)
 08/06/2024 0005 Received pt to room 3E-13 from ED with DX of enterocolitis,  Pt is A&O, only C/O is abdominal pain.  Tele monitor applied, CCMD notified and CHG bath given.  Oriented to room, call bell and bed.  Call bell in reach. Dasie Lamarr BROCKS

## 2024-08-06 NOTE — Assessment & Plan Note (Signed)
 08-06-2024 C. Diff and GI diarrhea panel are negative. Pt tolerating soft GI diet. Repeat CBC, CMP in AM. Hopefully home in AM.  08-07-2024 pt tolerating a soft gi diet x 24 hours. 08-07-2024 received her 3rd dose of IV rocephin  today. Pt refused lab work multiple times today. Stable for DC. Prn zofran  at discharge.

## 2024-08-06 NOTE — Assessment & Plan Note (Signed)
 08-06-2024 on IV rocephin  Day #2. Urine cx pending. Repeat CBC in AM.  08-07-2024 received her 3rd dose of IV rocephin  today. Urine cx was negative(final). Pt refused lab work multiple times today. Stable for DC

## 2024-08-06 NOTE — Assessment & Plan Note (Signed)
 08-06-2024 this was repleted and now resolved. Today's K is 3.6

## 2024-08-06 NOTE — Assessment & Plan Note (Signed)
 08-06-2024 UDS tested positive for cocaine, amphetamines and THC. Pt admitted to abusing illicit substances on admission.  08-07-2024 pt advised to stop using illicit drugs.

## 2024-08-06 NOTE — Assessment & Plan Note (Signed)
 08-06-2024 Body mass index is 35.93 kg/m.

## 2024-08-06 NOTE — Progress Notes (Addendum)
 PROGRESS NOTE    Jasmine Taylor  FMW:[id number] DOB: January 12, 1996 DOA: 08/05/2024 PCP: Pcp, No  Subjective: Pt seen and examined. Was hungry and asking for diet. Diarrhea improved. C. Diff negative as well as GI diarrhea panel. Eating and drinking soft diet without difficulty. No further N/V.  On 1 out of 4 bottle showing GPC. On IV rocephin  for UTI. Will await final ID on GPC.   Hospital Course: Jasmine Taylor is a 28 y.o. female with medical history significant for hepatitis C, polysubstance use who is admitted with sepsis due to enterocolitis.  CC: abd pain, Nausea, weakness, diarrhea HPI: Jasmine Taylor is a 28 y.o. female with medical history significant for hepatitis C, polysubstance use who presented to the ED for evaluation of abdominal pain.   Patient states that she developed new onset abdominal pain has been ongoing for the last 2 days.  Pain is mostly across her upper abdomen but sometimes involving lower abdomen as well.  She has had nausea but no significant emesis.  She has felt hot at home.  She says started having diarrhea while she was in the ED.  She does not recall anything in her diet which might have triggered her symptoms.   Patient reports a history of hepatitis C which has not been treated.  She states that she smokes half pack of cigarettes daily.  She admits to cocaine and methamphetamine use.  She also reports marijuana use.  She has a prior history of IV drug use but denies any use for several years.   Patient states she does not take any medications regularly.   ED Course  Labs/Imaging on admission: I have personally reviewed following labs and imaging studies.   Initial vitals showed BP 126/106, pulse 151, RR 17, temp 99.5 F, SpO2 99% on room air.   Labs show WBC 15.6, hemoglobin 16.7, platelets 179, sodium 133, potassium 3.1, bicarb 19, BUN 7, creatinine 0.71, serum glucose 108, lipase 21, ammonia 15, lactic acid 2.5.   Serum hCG  negative.  Urinalysis showed negative nitrites, moderate leukocytes, 21-50 RBCs, >50 WBCs, many bacteria.  Blood cultures in process.  UDS positive for cocaine, amphetamines, THC.   SARS-CoV-2, influenza, RSV PCR negative.   CTA chest negative for PE.  Mild central bronchial thickening and wall thickening of the distal esophagus seen.  Prominent low right hilar node is likely reactive.   CT abdomen/pelvis with contrast showed fluid-filled large and small bowel with areas of wall thickening typical of enterocolitis.  Hepatomegaly and hepatic steatosis seen.   Patient was given 3.25 liters LR, IV vancomycin , cefepime , and Flagyl , IV Toradol  30 mg.  Hospitalist service was consulted for admission.  Significant Events: Admitted 08/05/2024 for enterocolitis   Admission Labs: WBC 15.6, hemoglobin 16.7, platelets 179  sodium 133, potassium 3.1, bicarb 19, BUN 7, creatinine 0.71, serum glucose 108, lipase 21, ammonia 15, lactic acid 2.5.  Urinalysis showed negative nitrites, moderate leukocytes, 21-50 RBCs, >50 WBCs, many bacteria.  Blood cultures in process.  UDS positive for cocaine, amphetamines, THC.  SARS-CoV-2, influenza, RSV PCR negative   Admission Imaging Studies: CTA chest negative for PE. Mild central bronchial thickening and wall thickening of the distal esophagus seen. Prominent low right hilar node is likely reactive.  CT abdomen/pelvis with contrast showed fluid-filled large and small bowel with areas of wall thickening typical of enterocolitis. Hepatomegaly and hepatic steatosis seen.   Significant Labs:   Significant Imaging Studies:   Antibiotic Therapy: Anti-infectives (From admission,  onward)    Start     Dose/Rate Route Frequency Ordered Stop   08/06/24 0400  cefTRIAXone  (ROCEPHIN ) 2 g in sodium chloride  0.9 % 100 mL IVPB        2 g 200 mL/hr over 30 Minutes Intravenous Every 24 hours 08/05/24 2328 08/13/24 0359   08/05/24 2015  ceFEPIme  (MAXIPIME ) 2 g in sodium  chloride 0.9 % 100 mL IVPB        2 g 200 mL/hr over 30 Minutes Intravenous  Once 08/05/24 2008 08/05/24 2217   08/05/24 2015  metroNIDAZOLE  (FLAGYL ) IVPB 500 mg        500 mg 100 mL/hr over 60 Minutes Intravenous  Once 08/05/24 2008 08/05/24 2350   08/05/24 2015  vancomycin  (VANCOCIN ) IVPB 1000 mg/200 mL premix        1,000 mg 200 mL/hr over 60 Minutes Intravenous  Once 08/05/24 2008 08/05/24 2350       Procedures:   Consultants:     Assessment and Plan: * Sepsis (HCC) 08-06-2024 present on admission. Met sepsis criteria with WBC 15.6, lactic acid 2.5, enterocolitis seen on CT abd/pelvis  Acute cystitis without hematuria 08-06-2024 on IV rocephin  Day #1. Urine cx pending. Repeat CBC in AM.  Enterocolitis 08-06-2024 C. Diff and GI diarrhea panel are negative. Pt tolerating soft GI diet. Repeat CBC, CMP in AM. Hopefully home in AM.  Hypokalemia 08-06-2024 this was repleted and now resolved. Today's K is 3.6  Polysubstance use disorder 08-06-2024 UDS tested positive for cocaine, amphetamines and THC. Pt admitted to abusing illicit substances on admission.  Obesity, Class II, BMI 35-39.9 08-06-2024 Body mass index is 35.93 kg/m.   DVT prophylaxis: enoxaparin  (LOVENOX ) injection 40 mg Start: 08/06/24 1000    Code Status: Full Code Family Communication: no family at bedside. She is decisional. Disposition Plan: return home Reason for continuing need for hospitalization: remains on IV Abx.  Objective: Vitals:   08/06/24 0019 08/06/24 0336 08/06/24 0718 08/06/24 1540  BP:  129/79 (!) 132/94 (!) 131/91  Pulse:  (!) 109 (!) 104 (!) 103  Resp: 20 20 20 20   Temp: 98.1 F (36.7 C) 98.2 F (36.8 C) 98.7 F (37.1 C) 98.7 F (37.1 C)  TempSrc: Oral Oral Oral Oral  SpO2:  99% 99% 100%  Weight: 80.7 kg 80.7 kg    Height: 4' 11 (1.499 m)       Intake/Output Summary (Last 24 hours) at 08/06/2024 1554 Last data filed at 08/06/2024 0617 Gross per 24 hour  Intake --   Output 500 ml  Net -500 ml   Filed Weights   08/06/24 0019 08/06/24 0336  Weight: 80.7 kg 80.7 kg    Examination:  Physical Exam Vitals and nursing note reviewed.  Constitutional:      Appearance: She is obese.  HENT:     Head: Normocephalic and atraumatic.     Nose: Nose normal.  Cardiovascular:     Rate and Rhythm: Regular rhythm. Tachycardia present.  Pulmonary:     Effort: Pulmonary effort is normal.     Breath sounds: Normal breath sounds.  Abdominal:     General: Bowel sounds are normal. There is no distension.     Palpations: Abdomen is soft. There is no mass.     Tenderness: There is no abdominal tenderness.  Skin:    General: Skin is warm and dry.     Capillary Refill: Capillary refill takes less than 2 seconds.     Comments: Both feet are  very dirty. Appears she walks around in her bare feet  Neurological:     General: No focal deficit present.     Mental Status: She is alert and oriented to person, place, and time.     Data Reviewed: I have personally reviewed following labs and imaging studies  CBC: Recent Labs  Lab 08/05/24 1927 08/05/24 2027 08/06/24 0323  WBC 15.6*  --  14.3*  HGB 16.7* 17.0* 13.9  HCT 49.7* 50.0* 40.8  MCV 92.0  --  91.1  PLT 179  --  162   Basic Metabolic Panel: Recent Labs  Lab 08/05/24 1927 08/05/24 2027 08/06/24 0323  NA 133* 133* 135  K 3.1* 4.2 3.6  CL 101  --  106  CO2 19*  --  20*  GLUCOSE 108*  --  87  BUN 7  --  5*  CREATININE 0.71  --  0.63  CALCIUM  9.5  --  8.7*  MG 1.6*  --   --    GFR: Estimated Creatinine Clearance: 97.1 mL/min (by C-G formula based on SCr of 0.63 mg/dL). Liver Function Tests: Recent Labs  Lab 08/05/24 1927 08/06/24 0323  AST 40 27  ALT 69* 48*  ALKPHOS 75 62  BILITOT 1.3* 0.9  PROT 8.1 6.2*  ALBUMIN 3.8 2.8*   Recent Labs  Lab 08/05/24 1927  LIPASE 21   Recent Labs  Lab 08/05/24 1927  AMMONIA 15   Coagulation Profile: Recent Labs  Lab 08/05/24 2048  INR 1.2    Sepsis Labs: Recent Labs  Lab 08/05/24 2029 08/05/24 2306  LATICACIDVEN 2.5* 2.6*    Recent Results (from the past 240 hours)  Resp panel by RT-PCR (RSV, Flu A&B, Covid) Anterior Nasal Swab     Status: None   Collection Time: 08/05/24  8:06 PM   Specimen: Anterior Nasal Swab  Result Value Ref Range Status   SARS Coronavirus 2 by RT PCR NEGATIVE NEGATIVE Final   Influenza A by PCR NEGATIVE NEGATIVE Final   Influenza B by PCR NEGATIVE NEGATIVE Final    Comment: (NOTE) The Xpert Xpress SARS-CoV-2/FLU/RSV plus assay is intended as an aid in the diagnosis of influenza from Nasopharyngeal swab specimens and should not be used as a sole basis for treatment. Nasal washings and aspirates are unacceptable for Xpert Xpress SARS-CoV-2/FLU/RSV testing.  Fact Sheet for Patients: BloggerCourse.com  Fact Sheet for Healthcare Providers: SeriousBroker.it  This test is not yet approved or cleared by the [place]  FDA and has been authorized for detection and/or diagnosis of SARS-CoV-2 by FDA under an Emergency Use Authorization (EUA). This EUA will remain in effect (meaning this test can be used) for the duration of the COVID-19 declaration under Section 564(b)(1) of the Act, 21 U.S.C. section 360bbb-3(b)(1), unless the authorization is terminated or revoked.     Resp Syncytial Virus by PCR NEGATIVE NEGATIVE Final    Comment: (NOTE) Fact Sheet for Patients: BloggerCourse.com  Fact Sheet for Healthcare Providers: SeriousBroker.it  This test is not yet approved or cleared by the [place]  FDA and has been authorized for detection and/or diagnosis of SARS-CoV-2 by FDA under an Emergency Use Authorization (EUA). This EUA will remain in effect (meaning this test can be used) for the duration of the COVID-19 declaration under Section 564(b)(1) of the Act, 21 U.S.C. section  360bbb-3(b)(1), unless the authorization is terminated or revoked.  Performed at [facility], [address], [place], [place] [address]   Blood Culture (routine x 2)  Status: None (Preliminary result)   Collection Time: 08/05/24  8:48 PM   Specimen: BLOOD LEFT WRIST  Result Value Ref Range Status   Specimen Description BLOOD LEFT WRIST  Final   Special Requests   Final    BOTTLES DRAWN AEROBIC ONLY Blood Culture results may not be optimal due to an inadequate volume of blood received in culture bottles   Culture  Setup Time   Final    GRAM POSITIVE COCCI AEROBIC BOTTLE ONLY Organism ID to follow Performed at [facility], [address], [place], [place] [address]    Culture GRAM POSITIVE COCCI  Final   Report Status PENDING  Incomplete  Blood Culture (routine x 2)     Status: None (Preliminary result)   Collection Time: 08/05/24  8:48 PM   Specimen: BLOOD LEFT HAND  Result Value Ref Range Status   Specimen Description BLOOD LEFT HAND  Final   Special Requests   Final    BOTTLES DRAWN AEROBIC AND ANAEROBIC BLOOD LEFT HAND   Culture   Final    NO GROWTH < 12 HOURS Performed at [facility], [address], [place], [place] [address]    Report Status PENDING  Incomplete  C Difficile Quick Screen w PCR reflex     Status: None   Collection Time: 08/05/24 11:29 PM   Specimen: Stool  Result Value Ref Range Status   C Diff antigen NEGATIVE NEGATIVE Final   C Diff toxin NEGATIVE NEGATIVE Final   C Diff interpretation No C. difficile detected.  Final    Comment: Performed at [facility], [address] [place], [place] [address]  Gastrointestinal Panel by PCR , Stool     Status: None   Collection Time: 08/05/24 11:29 PM   Specimen: Stool  Result Value Ref Range Status   Campylobacter species NOT DETECTED NOT DETECTED Final   Plesimonas shigelloides NOT DETECTED NOT DETECTED Final   Salmonella species NOT DETECTED NOT DETECTED Final   Yersinia  enterocolitica NOT DETECTED NOT DETECTED Final   Vibrio species NOT DETECTED NOT DETECTED Final   Vibrio cholerae NOT DETECTED NOT DETECTED Final   Enteroaggregative E coli (EAEC) NOT DETECTED NOT DETECTED Final   Enteropathogenic E coli (EPEC) NOT DETECTED NOT DETECTED Final   Enterotoxigenic E coli (ETEC) NOT DETECTED NOT DETECTED Final   Shiga like toxin producing E coli (STEC) NOT DETECTED NOT DETECTED Final   Shigella/Enteroinvasive E coli (EIEC) NOT DETECTED NOT DETECTED Final   Cryptosporidium NOT DETECTED NOT DETECTED Final   Cyclospora cayetanensis NOT DETECTED NOT DETECTED Final   Entamoeba histolytica NOT DETECTED NOT DETECTED Final   Giardia lamblia NOT DETECTED NOT DETECTED Final   Adenovirus F40/41 NOT DETECTED NOT DETECTED Final   Astrovirus NOT DETECTED NOT DETECTED Final   Norovirus GI/GII NOT DETECTED NOT DETECTED Final   Rotavirus A NOT DETECTED NOT DETECTED Final   Sapovirus (I, II, IV, and V) NOT DETECTED NOT DETECTED Final    Comment: Performed at [facility], [address], [place], [place] [address]     Radiology Studies: CT ABDOMEN PELVIS W CONTRAST Result Date: 08/05/2024 CLINICAL DATA:  Upper abdominal pain.  Nausea. EXAM: CT ABDOMEN AND PELVIS WITH CONTRAST TECHNIQUE: Multidetector CT imaging of the abdomen and pelvis was performed using the standard protocol following bolus administration of intravenous contrast. RADIATION DOSE REDUCTION: This exam was performed according to the departmental dose-optimization program which includes automated exposure control, adjustment of the  mA and/or kV according to patient size and/or use of iterative reconstruction technique. CONTRAST:  80mL OMNIPAQUE  IOHEXOL  350 MG/ML SOLN COMPARISON:  None Available. FINDINGS: Lower chest: Assessed on concurrent chest CT, reported separately. Hepatobiliary: Mild diffuse hepatic steatosis. The liver is enlarged spanning 19.1 cm cranial caudal. No focal liver abnormality.  Gallbladder physiologically distended, no calcified stone. No biliary dilatation. Pancreas: No ductal dilatation or inflammation. Spleen: Normal in size without focal abnormality. Adrenals/Urinary Tract: Normal adrenal glands. No hydronephrosis or perinephric edema. Homogeneous renal enhancement with symmetric excretion on delayed phase imaging. Urinary bladder is partially distended without wall thickening. Stomach/Bowel: Fluid-filled small bowel in the mid lower abdomen with areas of wall thickening and mesenteric edema. No bowel pneumatosis. No obstruction. Diffuse liquid stool throughout the colon. Occasional areas of colonic wall thickening particularly the sigmoid. Normal appendix. Physiologically distended stomach without gastric wall thickening. Vascular/Lymphatic: Normal caliber abdominal aorta. Patent portal vein. No portal venous or mesenteric gas. Retroaortic left renal vein. No abdominopelvic adenopathy. Reproductive: Low density in the cervix, nonspecific. No adnexal mass. Other: Trace pelvic free fluid. No focal fluid collection. No free intra-abdominal air. Musculoskeletal: There are no acute or suspicious osseous abnormalities. IMPRESSION: 1. Fluid-filled large and small bowel with areas of wall thickening typical of enterocolitis. This may be infectious or inflammatory. 2. Hepatomegaly and hepatic steatosis. 3. Low density in the cervix, nonspecific. Recommend correlation with physical exam. Electronically Signed   By: Andrea Gasman M.D.   On: 08/05/2024 22:34   CT Angio Chest PE W and/or Wo Contrast Result Date: 08/05/2024 CLINICAL DATA:  Provided history: Pulmonary embolism (PE) suspected, high prob Upper abdominal pain.  Nausea.  EXAM: CT ANGIOGRAPHY CHEST WITH CONTRAST TECHNIQUE: Multidetector CT imaging of the chest was performed using the standard protocol during bolus administration of intravenous contrast. Multiplanar CT image reconstructions and MIPs were obtained to evaluate the  vascular anatomy. RADIATION DOSE REDUCTION: This exam was performed according to the departmental dose-optimization program which includes automated exposure control, adjustment of the mA and/or kV according to patient size and/or use of iterative reconstruction technique. CONTRAST:  80mL OMNIPAQUE  IOHEXOL  350 MG/ML SOLN COMPARISON:  Chest radiograph earlier today FINDINGS: Cardiovascular: There are no filling defects within the pulmonary arteries to suggest pulmonary embolus. Thoracic aorta is normal in caliber, no acute aortic findings. The heart is normal in size. No pericardial effusion. Mediastinum/Nodes: Wall thickening of the distal esophagus. No enlarged mediastinal nodes. Prominent low right hilar node measures 11 mm short axis. No thyroid nodule. Lungs/Pleura: Mild central bronchial thickening. No focal airspace disease. No pleural effusion. No features of pulmonary edema. No suspicious pulmonary nodule. Upper Abdomen: Assessed on concurrent abdominopelvic CT, reported separately. Musculoskeletal: There are no acute or suspicious osseous abnormalities. Review of the MIP images confirms the above findings. IMPRESSION: 1. No pulmonary embolus. 2. Mild central bronchial thickening, can be seen with bronchitis or reactive airways disease. 3. Wall thickening of the distal esophagus, query reflux or esophagitis. 4. Prominent low right hilar node is likely reactive. Electronically Signed   By: Andrea Gasman M.D.   On: 08/05/2024 22:29   DG Chest Port 1 View Result Date: 08/05/2024 CLINICAL DATA:  Possible sepsis EXAM: PORTABLE CHEST 1 VIEW COMPARISON:  09/09/2016 FINDINGS: The heart size and mediastinal contours are within normal limits. Both lungs are clear. The visualized skeletal structures are unremarkable. Hazy right lung base probably due to overlying soft tissue artifact. IMPRESSION: No active disease. Electronically Signed   By: Luke Scott HERO.D.  On: 08/05/2024 20:50    Scheduled Meds:   enoxaparin  (LOVENOX ) injection  40 mg Subcutaneous Q24H   nicotine   21 mg Transdermal Daily   sodium chloride  flush  3 mL Intravenous Q12H   Continuous Infusions:  cefTRIAXone  (ROCEPHIN )  IV 2 g (08/06/24 0449)     LOS: 0 days   Time spent: 60 minutes  Camellia Door, DO  [facility]  08/06/2024, 3:54 PM

## 2024-08-07 DIAGNOSIS — N3000 Acute cystitis without hematuria: Secondary | ICD-10-CM | POA: Diagnosis not present

## 2024-08-07 DIAGNOSIS — K529 Noninfective gastroenteritis and colitis, unspecified: Secondary | ICD-10-CM | POA: Diagnosis not present

## 2024-08-07 DIAGNOSIS — F1990 Other psychoactive substance use, unspecified, uncomplicated: Secondary | ICD-10-CM | POA: Diagnosis not present

## 2024-08-07 DIAGNOSIS — A419 Sepsis, unspecified organism: Secondary | ICD-10-CM | POA: Diagnosis not present

## 2024-08-07 LAB — URINE CULTURE: Culture: NO GROWTH

## 2024-08-07 MED ORDER — ONDANSETRON HCL 4 MG PO TABS: 4.0000 mg | ORAL_TABLET | Freq: Four times a day (QID) | ORAL | 0 refills | Status: AC | PRN

## 2024-08-07 NOTE — Progress Notes (Addendum)
 Patient refused morning lab draws this morning at 0500, 0945, and at 1323. MD aware.

## 2024-08-07 NOTE — Progress Notes (Signed)
 PROGRESS NOTE    Jasmine Taylor  FMW:[id number] DOB: 09-24-1996 DOA: 08/05/2024 PCP: Pcp, No  Subjective: Pt seen and examined.   Pt tolerating soft diet.  Pt refused lab work 3 times today. See RN charting.  Blood cx grew staph epi. Considered a contaminant.  Urine cx negative(final).  Pt has received 3 days of IV rocephin . Stable for DC.   Hospital Course: Jasmine Taylor is a 28 y.o. female with medical history significant for hepatitis C, polysubstance use who is admitted with sepsis due to enterocolitis.  CC: abd pain, Nausea, weakness, diarrhea HPI: Jasmine Taylor is a 28 y.o. female with medical history significant for hepatitis C, polysubstance use who presented to the ED for evaluation of abdominal pain.   Patient states that she developed new onset abdominal pain has been ongoing for the last 2 days.  Pain is mostly across her upper abdomen but sometimes involving lower abdomen as well.  She has had nausea but no significant emesis.  She has felt hot at home.  She says started having diarrhea while she was in the ED.  She does not recall anything in her diet which might have triggered her symptoms.   Patient reports a history of hepatitis C which has not been treated.  She states that she smokes half pack of cigarettes daily.  She admits to cocaine and methamphetamine use.  She also reports marijuana use.  She has a prior history of IV drug use but denies any use for several years.   Patient states she does not take any medications regularly.   ED Course  Labs/Imaging on admission: I have personally reviewed following labs and imaging studies.   Initial vitals showed BP 126/106, pulse 151, RR 17, temp 99.5 F, SpO2 99% on room air.   Labs show WBC 15.6, hemoglobin 16.7, platelets 179, sodium 133, potassium 3.1, bicarb 19, BUN 7, creatinine 0.71, serum glucose 108, lipase 21, ammonia 15, lactic acid 2.5.   Serum hCG negative.  Urinalysis showed  negative nitrites, moderate leukocytes, 21-50 RBCs, >50 WBCs, many bacteria.  Blood cultures in process.  UDS positive for cocaine, amphetamines, THC.   SARS-CoV-2, influenza, RSV PCR negative.   CTA chest negative for PE.  Mild central bronchial thickening and wall thickening of the distal esophagus seen.  Prominent low right hilar node is likely reactive.   CT abdomen/pelvis with contrast showed fluid-filled large and small bowel with areas of wall thickening typical of enterocolitis.  Hepatomegaly and hepatic steatosis seen.   Patient was given 3.25 liters LR, IV vancomycin , cefepime , and Flagyl , IV Toradol  30 mg.  Hospitalist service was consulted for admission.  Significant Events: Admitted 08/05/2024 for enterocolitis   Admission Labs: WBC 15.6, hemoglobin 16.7, platelets 179  sodium 133, potassium 3.1, bicarb 19, BUN 7, creatinine 0.71, serum glucose 108, lipase 21, ammonia 15, lactic acid 2.5.  Urinalysis showed negative nitrites, moderate leukocytes, 21-50 RBCs, >50 WBCs, many bacteria.  Blood cultures in process.  UDS positive for cocaine, amphetamines, THC.  SARS-CoV-2, influenza, RSV PCR negative   Admission Imaging Studies: CTA chest negative for PE. Mild central bronchial thickening and wall thickening of the distal esophagus seen. Prominent low right hilar node is likely reactive.  CT abdomen/pelvis with contrast showed fluid-filled large and small bowel with areas of wall thickening typical of enterocolitis. Hepatomegaly and hepatic steatosis seen.   Significant Labs:   Significant Imaging Studies:   Antibiotic Therapy: Anti-infectives (From admission, onward)    Start  Dose/Rate Route Frequency Ordered Stop   08/06/24 0400  cefTRIAXone  (ROCEPHIN ) 2 g in sodium chloride  0.9 % 100 mL IVPB        2 g 200 mL/hr over 30 Minutes Intravenous Every 24 hours 08/05/24 2328 08/13/24 0359   08/05/24 2015  ceFEPIme  (MAXIPIME ) 2 g in sodium chloride  0.9 % 100 mL IVPB         2 g 200 mL/hr over 30 Minutes Intravenous  Once 08/05/24 2008 08/05/24 2217   08/05/24 2015  metroNIDAZOLE  (FLAGYL ) IVPB 500 mg        500 mg 100 mL/hr over 60 Minutes Intravenous  Once 08/05/24 2008 08/05/24 2350   08/05/24 2015  vancomycin  (VANCOCIN ) IVPB 1000 mg/200 mL premix        1,000 mg 200 mL/hr over 60 Minutes Intravenous  Once 08/05/24 2008 08/05/24 2350       Procedures:   Consultants:     Assessment and Plan: * Sepsis (HCC) 08-06-2024 present on admission. Met sepsis criteria with WBC 15.6, lactic acid 2.5, enterocolitis seen on CT abd/pelvis  Acute cystitis without hematuria 08-06-2024 on IV rocephin  Day #2. Urine cx pending. Repeat CBC in AM.  08-07-2024 received her 3rd dose of IV rocephin  today. Urine cx was negative(final). Pt refused lab work multiple times today. Stable for DC  Enterocolitis 08-06-2024 C. Diff and GI diarrhea panel are negative. Pt tolerating soft GI diet. Repeat CBC, CMP in AM. Hopefully home in AM.  08-07-2024 pt tolerating a soft gi diet x 24 hours. 08-07-2024 received her 3rd dose of IV rocephin  today. Pt refused lab work multiple times today. Stable for DC. Prn zofran  at discharge.  Polysubstance use disorder 08-06-2024 UDS tested positive for cocaine, amphetamines and THC. Pt admitted to abusing illicit substances on admission.  08-07-2024 pt advised to stop using illicit drugs.  Hypokalemia-resolved as of 08/07/2024 08-06-2024 this was repleted and now resolved. Today's K is 3.6  Obesity, Class II, BMI 35-39.9 08-06-2024 Body mass index is 35.93 kg/m.   DVT prophylaxis: enoxaparin  (LOVENOX ) injection 40 mg Start: 08/06/24 1000    Code Status: Full Code Family Communication: no family at bedside. She is decisional. Disposition Plan: return home Reason for continuing need for hospitalization: stable for DC today.  Objective: Vitals:   08/07/24 0457 08/07/24 0507 08/07/24 0741 08/07/24 1051  BP: 123/81  (!) 140/94 (!)  144/97  Pulse: 99  98 (!) 105  Resp: 18  18 20   Temp: 99 F (37.2 C)  99.2 F (37.3 C) 98.4 F (36.9 C)  TempSrc: Oral   Oral  SpO2: 97%  97% 99%  Weight:  80 kg    Height:        Intake/Output Summary (Last 24 hours) at 08/07/2024 1448 Last data filed at 08/07/2024 0014 Gross per 24 hour  Intake 50 ml  Output --  Net 50 ml   Filed Weights   08/06/24 0019 08/06/24 0336 08/07/24 0507  Weight: 80.7 kg 80.7 kg 80 kg    Examination:  Physical Exam Vitals and nursing note reviewed.  Constitutional:      Appearance: She is obese.  HENT:     Head: Normocephalic and atraumatic.  Eyes:     General: No scleral icterus. Cardiovascular:     Rate and Rhythm: Normal rate and regular rhythm.  Pulmonary:     Effort: Pulmonary effort is normal.     Breath sounds: Normal breath sounds.  Abdominal:     General: Bowel sounds are  normal. There is no distension.     Palpations: Abdomen is soft. There is no mass.     Tenderness: There is no abdominal tenderness.  Skin:    General: Skin is warm and dry.     Capillary Refill: Capillary refill takes less than 2 seconds.  Neurological:     General: No focal deficit present.     Mental Status: She is alert and oriented to person, place, and time.     Data Reviewed: I have personally reviewed following labs and imaging studies  CBC: Recent Labs  Lab 08/05/24 1927 08/05/24 2027 08/06/24 0323  WBC 15.6*  --  14.3*  HGB 16.7* 17.0* 13.9  HCT 49.7* 50.0* 40.8  MCV 92.0  --  91.1  PLT 179  --  162   Basic Metabolic Panel: Recent Labs  Lab 08/05/24 1927 08/05/24 2027 08/06/24 0323  NA 133* 133* 135  K 3.1* 4.2 3.6  CL 101  --  106  CO2 19*  --  20*  GLUCOSE 108*  --  87  BUN 7  --  5*  CREATININE 0.71  --  0.63  CALCIUM  9.5  --  8.7*  MG 1.6*  --   --    GFR: Estimated Creatinine Clearance: 96.6 mL/min (by C-G formula based on SCr of 0.63 mg/dL). Liver Function Tests: Recent Labs  Lab 08/05/24 1927 08/06/24 0323   AST 40 27  ALT 69* 48*  ALKPHOS 75 62  BILITOT 1.3* 0.9  PROT 8.1 6.2*  ALBUMIN 3.8 2.8*   Recent Labs  Lab 08/05/24 1927  LIPASE 21   Recent Labs  Lab 08/05/24 1927  AMMONIA 15   Coagulation Profile: Recent Labs  Lab 08/05/24 2048  INR 1.2   Sepsis Labs: Recent Labs  Lab 08/05/24 2029 08/05/24 2306  LATICACIDVEN 2.5* 2.6*    Recent Results (from the past 240 hours)  Resp panel by RT-PCR (RSV, Flu A&B, Covid) Anterior Nasal Swab     Status: None   Collection Time: 08/05/24  8:06 PM   Specimen: Anterior Nasal Swab  Result Value Ref Range Status   SARS Coronavirus 2 by RT PCR NEGATIVE NEGATIVE Final   Influenza A by PCR NEGATIVE NEGATIVE Final   Influenza B by PCR NEGATIVE NEGATIVE Final    Comment: (NOTE) The Xpert Xpress SARS-CoV-2/FLU/RSV plus assay is intended as an aid in the diagnosis of influenza from Nasopharyngeal swab specimens and should not be used as a sole basis for treatment. Nasal washings and aspirates are unacceptable for Xpert Xpress SARS-CoV-2/FLU/RSV testing.  Fact Sheet for Patients: BloggerCourse.com  Fact Sheet for Healthcare Providers: SeriousBroker.it  This test is not yet approved or cleared by the [place]  FDA and has been authorized for detection and/or diagnosis of SARS-CoV-2 by FDA under an Emergency Use Authorization (EUA). This EUA will remain in effect (meaning this test can be used) for the duration of the COVID-19 declaration under Section 564(b)(1) of the Act, 21 U.S.C. section 360bbb-3(b)(1), unless the authorization is terminated or revoked.     Resp Syncytial Virus by PCR NEGATIVE NEGATIVE Final    Comment: (NOTE) Fact Sheet for Patients: BloggerCourse.com  Fact Sheet for Healthcare Providers: SeriousBroker.it  This test is not yet approved or cleared by the [place]  FDA and has been authorized for  detection and/or diagnosis of SARS-CoV-2 by FDA under an Emergency Use Authorization (EUA). This EUA will remain in effect (meaning this test can be used) for the duration of the  COVID-19 declaration under Section 564(b)(1) of the Act, 21 U.S.C. section 360bbb-3(b)(1), unless the authorization is terminated or revoked.  Performed at [facility], [address] [place], [place] [address]   Blood Culture (routine x 2)     Status: Abnormal (Preliminary result)   Collection Time: 08/05/24  8:48 PM   Specimen: BLOOD LEFT WRIST  Result Value Ref Range Status   Specimen Description BLOOD LEFT WRIST  Final   Special Requests   Final    BOTTLES DRAWN AEROBIC ONLY Blood Culture results may not be optimal due to an inadequate volume of blood received in culture bottles   Culture  Setup Time   Final    GRAM POSITIVE COCCI AEROBIC BOTTLE ONLY CRITICAL RESULT CALLED TO, READ BACK BY AND VERIFIED WITH: PHARMD KIMBERLY HAMMONS [id number] AT 1656 BY EC    Culture (A)  Final    STAPHYLOCOCCUS HAEMOLYTICUS THE SIGNIFICANCE OF ISOLATING THIS ORGANISM FROM A SINGLE SET OF BLOOD CULTURES WHEN MULTIPLE SETS ARE DRAWN IS UNCERTAIN. PLEASE NOTIFY THE MICROBIOLOGY DEPARTMENT WITHIN ONE WEEK IF SPECIATION AND SENSITIVITIES ARE REQUIRED. Performed at [facility], [address] [place], [place] [address]    Report Status PENDING  Incomplete  Blood Culture (routine x 2)     Status: None (Preliminary result)   Collection Time: 08/05/24  8:48 PM   Specimen: BLOOD LEFT HAND  Result Value Ref Range Status   Specimen Description BLOOD LEFT HAND  Final   Special Requests   Final    BOTTLES DRAWN AEROBIC AND ANAEROBIC BLOOD LEFT HAND   Culture   Final    NO GROWTH 2 DAYS Performed at [facility], [address], [place], [place] [address]    Report Status PENDING  Incomplete  Blood Culture ID Panel (Reflexed)     Status: Abnormal   Collection Time: 08/05/24  8:48 PM  Result Value Ref Range  Status   Enterococcus faecalis NOT DETECTED NOT DETECTED Final   Enterococcus Faecium NOT DETECTED NOT DETECTED Final   Listeria monocytogenes NOT DETECTED NOT DETECTED Final   Staphylococcus species DETECTED (A) NOT DETECTED Final    Comment: CRITICAL RESULT CALLED TO, READ BACK BY AND VERIFIED WITH: PHARMD KIMBERLY HAMMONS [id number] AT 1656 BY EC    Staphylococcus aureus (BCID) NOT DETECTED NOT DETECTED Final   Staphylococcus epidermidis DETECTED (A) NOT DETECTED Final    Comment: CRITICAL RESULT CALLED TO, READ BACK BY AND VERIFIED WITH: PHARMD KIMBERLY HAMMONS [id number] AT 1656 BY EC    Staphylococcus lugdunensis NOT DETECTED NOT DETECTED Final   Streptococcus species NOT DETECTED NOT DETECTED Final   Streptococcus agalactiae NOT DETECTED NOT DETECTED Final   Streptococcus pneumoniae NOT DETECTED NOT DETECTED Final   Streptococcus pyogenes NOT DETECTED NOT DETECTED Final   A.calcoaceticus-baumannii NOT DETECTED NOT DETECTED Final   Bacteroides fragilis NOT DETECTED NOT DETECTED Final   Enterobacterales NOT DETECTED NOT DETECTED Final   Enterobacter cloacae complex NOT DETECTED NOT DETECTED Final   Escherichia coli NOT DETECTED NOT DETECTED Final   Klebsiella aerogenes NOT DETECTED NOT DETECTED Final   Klebsiella oxytoca NOT DETECTED NOT DETECTED Final   Klebsiella pneumoniae NOT DETECTED NOT DETECTED Final   Proteus species NOT DETECTED NOT DETECTED Final   Salmonella species NOT DETECTED NOT DETECTED Final   Serratia marcescens NOT DETECTED NOT DETECTED Final   Haemophilus influenzae NOT DETECTED NOT DETECTED Final   Neisseria meningitidis NOT DETECTED NOT DETECTED Final   Pseudomonas aeruginosa  NOT DETECTED NOT DETECTED Final   Stenotrophomonas maltophilia NOT DETECTED NOT DETECTED Final   Candida albicans NOT DETECTED NOT DETECTED Final   Candida auris NOT DETECTED NOT DETECTED Final   Candida glabrata NOT DETECTED NOT DETECTED Final   Candida krusei NOT DETECTED NOT DETECTED  Final   Candida parapsilosis NOT DETECTED NOT DETECTED Final   Candida tropicalis NOT DETECTED NOT DETECTED Final   Cryptococcus neoformans/gattii NOT DETECTED NOT DETECTED Final   Methicillin resistance mecA/C NOT DETECTED NOT DETECTED Final    Comment: Performed at [facility], [address], [place], [place] [address]  Urine Culture (for pregnant, neutropenic or urologic patients or patients with an indwelling urinary catheter)     Status: None   Collection Time: 08/05/24 11:08 PM   Specimen: Urine, Clean Catch  Result Value Ref Range Status   Specimen Description URINE, CLEAN CATCH  Final   Special Requests NONE  Final   Culture   Final    NO GROWTH Performed at [facility], [address] [place], [place] [address]    Report Status 08/07/2024 FINAL  Final  C Difficile Quick Screen w PCR reflex     Status: None   Collection Time: 08/05/24 11:29 PM   Specimen: Stool  Result Value Ref Range Status   C Diff antigen NEGATIVE NEGATIVE Final   C Diff toxin NEGATIVE NEGATIVE Final   C Diff interpretation No C. difficile detected.  Final    Comment: Performed at [facility], [address] [place], [place] [address]  Gastrointestinal Panel by PCR , Stool     Status: None   Collection Time: 08/05/24 11:29 PM   Specimen: Stool  Result Value Ref Range Status   Campylobacter species NOT DETECTED NOT DETECTED Final   Plesimonas shigelloides NOT DETECTED NOT DETECTED Final   Salmonella species NOT DETECTED NOT DETECTED Final   Yersinia enterocolitica NOT DETECTED NOT DETECTED Final   Vibrio species NOT DETECTED NOT DETECTED Final   Vibrio cholerae NOT DETECTED NOT DETECTED Final   Enteroaggregative E coli (EAEC) NOT DETECTED NOT DETECTED Final   Enteropathogenic E coli (EPEC) NOT DETECTED NOT DETECTED Final   Enterotoxigenic E coli (ETEC) NOT DETECTED NOT DETECTED Final   Shiga like toxin producing E coli (STEC) NOT DETECTED NOT DETECTED Final    Shigella/Enteroinvasive E coli (EIEC) NOT DETECTED NOT DETECTED Final   Cryptosporidium NOT DETECTED NOT DETECTED Final   Cyclospora cayetanensis NOT DETECTED NOT DETECTED Final   Entamoeba histolytica NOT DETECTED NOT DETECTED Final   Giardia lamblia NOT DETECTED NOT DETECTED Final   Adenovirus F40/41 NOT DETECTED NOT DETECTED Final   Astrovirus NOT DETECTED NOT DETECTED Final   Norovirus GI/GII NOT DETECTED NOT DETECTED Final   Rotavirus A NOT DETECTED NOT DETECTED Final   Sapovirus (I, II, IV, and V) NOT DETECTED NOT DETECTED Final    Comment: Performed at [facility], [address], [place], [place] [address]     Radiology Studies: CT ABDOMEN PELVIS W CONTRAST Result Date: 08/05/2024 CLINICAL DATA:  Upper abdominal pain.  Nausea. EXAM: CT ABDOMEN AND PELVIS WITH CONTRAST TECHNIQUE: Multidetector CT imaging of the abdomen and pelvis was performed using the standard protocol following bolus administration of intravenous contrast. RADIATION DOSE REDUCTION: This exam was performed according to the departmental dose-optimization program which includes automated exposure control, adjustment of the mA and/or kV according to patient size and/or use of iterative reconstruction technique. CONTRAST:  80mL OMNIPAQUE   IOHEXOL  350 MG/ML SOLN COMPARISON:  None Available. FINDINGS: Lower chest: Assessed on concurrent chest CT, reported separately. Hepatobiliary: Mild diffuse hepatic steatosis. The liver is enlarged spanning 19.1 cm cranial caudal. No focal liver abnormality. Gallbladder physiologically distended, no calcified stone. No biliary dilatation. Pancreas: No ductal dilatation or inflammation. Spleen: Normal in size without focal abnormality. Adrenals/Urinary Tract: Normal adrenal glands. No hydronephrosis or perinephric edema. Homogeneous renal enhancement with symmetric excretion on delayed phase imaging. Urinary bladder is partially distended without wall thickening. Stomach/Bowel:  Fluid-filled small bowel in the mid lower abdomen with areas of wall thickening and mesenteric edema. No bowel pneumatosis. No obstruction. Diffuse liquid stool throughout the colon. Occasional areas of colonic wall thickening particularly the sigmoid. Normal appendix. Physiologically distended stomach without gastric wall thickening. Vascular/Lymphatic: Normal caliber abdominal aorta. Patent portal vein. No portal venous or mesenteric gas. Retroaortic left renal vein. No abdominopelvic adenopathy. Reproductive: Low density in the cervix, nonspecific. No adnexal mass. Other: Trace pelvic free fluid. No focal fluid collection. No free intra-abdominal air. Musculoskeletal: There are no acute or suspicious osseous abnormalities. IMPRESSION: 1. Fluid-filled large and small bowel with areas of wall thickening typical of enterocolitis. This may be infectious or inflammatory. 2. Hepatomegaly and hepatic steatosis. 3. Low density in the cervix, nonspecific. Recommend correlation with physical exam. Electronically Signed   By: Andrea Gasman M.D.   On: 08/05/2024 22:34   CT Angio Chest PE W and/or Wo Contrast Result Date: 08/05/2024 CLINICAL DATA:  Provided history: Pulmonary embolism (PE) suspected, high prob Upper abdominal pain.  Nausea.  EXAM: CT ANGIOGRAPHY CHEST WITH CONTRAST TECHNIQUE: Multidetector CT imaging of the chest was performed using the standard protocol during bolus administration of intravenous contrast. Multiplanar CT image reconstructions and MIPs were obtained to evaluate the vascular anatomy. RADIATION DOSE REDUCTION: This exam was performed according to the departmental dose-optimization program which includes automated exposure control, adjustment of the mA and/or kV according to patient size and/or use of iterative reconstruction technique. CONTRAST:  80mL OMNIPAQUE  IOHEXOL  350 MG/ML SOLN COMPARISON:  Chest radiograph earlier today FINDINGS: Cardiovascular: There are no filling defects within  the pulmonary arteries to suggest pulmonary embolus. Thoracic aorta is normal in caliber, no acute aortic findings. The heart is normal in size. No pericardial effusion. Mediastinum/Nodes: Wall thickening of the distal esophagus. No enlarged mediastinal nodes. Prominent low right hilar node measures 11 mm short axis. No thyroid nodule. Lungs/Pleura: Mild central bronchial thickening. No focal airspace disease. No pleural effusion. No features of pulmonary edema. No suspicious pulmonary nodule. Upper Abdomen: Assessed on concurrent abdominopelvic CT, reported separately. Musculoskeletal: There are no acute or suspicious osseous abnormalities. Review of the MIP images confirms the above findings. IMPRESSION: 1. No pulmonary embolus. 2. Mild central bronchial thickening, can be seen with bronchitis or reactive airways disease. 3. Wall thickening of the distal esophagus, query reflux or esophagitis. 4. Prominent low right hilar node is likely reactive. Electronically Signed   By: Andrea Gasman M.D.   On: 08/05/2024 22:29   DG Chest Port 1 View Result Date: 08/05/2024 CLINICAL DATA:  Possible sepsis EXAM: PORTABLE CHEST 1 VIEW COMPARISON:  09/09/2016 FINDINGS: The heart size and mediastinal contours are within normal limits. Both lungs are clear. The visualized skeletal structures are unremarkable. Hazy right lung base probably due to overlying soft tissue artifact. IMPRESSION: No active disease. Electronically Signed   By: Luke Bun M.D.   On: 08/05/2024 20:50    Scheduled Meds:  enoxaparin  (LOVENOX ) injection  40 mg Subcutaneous  Q24H   nicotine   21 mg Transdermal Daily   sodium chloride  flush  3 mL Intravenous Q12H   Continuous Infusions:  cefTRIAXone  (ROCEPHIN )  IV 2 g (08/07/24 0502)     LOS: 1 day   Time spent: 55 minutes  Camellia Door, DO  [facility]  08/07/2024, 2:48 PM

## 2024-08-07 NOTE — Discharge Summary (Signed)
 [facility] Physician Discharge Summary   Patient name: Jasmine Taylor  Admit date:     08/05/2024  Discharge date: 08/07/2024  Attending Physician: LAURENCE, Otha Rickles [[id number]]  Discharge Physician: Camellia LAURENCE   PCP: Pcp, No  Admitted From: Home  Disposition:  Home  Recommendations for Outpatient Follow-up:  Follow up with PCP in 1-2 weeks  Home Health:No Equipment/Devices: None  Discharge Condition:Stable CODE STATUS:FULL Diet recommendation: Soft, bland GI diet Fluid Restriction: None  Hospital Summary: Jasmine Taylor is a 28 y.o. female with medical history significant for hepatitis C, polysubstance use who is admitted with sepsis due to enterocolitis.  CC: abd pain, Nausea, weakness, diarrhea HPI: Jasmine Taylor is a 28 y.o. female with medical history significant for hepatitis C, polysubstance use who presented to the ED for evaluation of abdominal pain.   Patient states that she developed new onset abdominal pain has been ongoing for the last 2 days.  Pain is mostly across her upper abdomen but sometimes involving lower abdomen as well.  She has had nausea but no significant emesis.  She has felt hot at home.  She says started having diarrhea while she was in the ED.  She does not recall anything in her diet which might have triggered her symptoms.   Patient reports a history of hepatitis C which has not been treated.  She states that she smokes half pack of cigarettes daily.  She admits to cocaine and methamphetamine use.  She also reports marijuana use.  She has a prior history of IV drug use but denies any use for several years.   Patient states she does not take any medications regularly.   ED Course  Labs/Imaging on admission: I have personally reviewed following labs and imaging studies.   Initial vitals showed BP 126/106, pulse 151, RR 17, temp 99.5 F, SpO2 99% on room air.   Labs show WBC 15.6, hemoglobin 16.7, platelets 179, sodium 133,  potassium 3.1, bicarb 19, BUN 7, creatinine 0.71, serum glucose 108, lipase 21, ammonia 15, lactic acid 2.5.   Serum hCG negative.  Urinalysis showed negative nitrites, moderate leukocytes, 21-50 RBCs, >50 WBCs, many bacteria.  Blood cultures in process.  UDS positive for cocaine, amphetamines, THC.   SARS-CoV-2, influenza, RSV PCR negative.   CTA chest negative for PE.  Mild central bronchial thickening and wall thickening of the distal esophagus seen.  Prominent low right hilar node is likely reactive.   CT abdomen/pelvis with contrast showed fluid-filled large and small bowel with areas of wall thickening typical of enterocolitis.  Hepatomegaly and hepatic steatosis seen.   Patient was given 3.25 liters LR, IV vancomycin , cefepime , and Flagyl , IV Toradol  30 mg.  Hospitalist service was consulted for admission.  Significant Events: Admitted 08/05/2024 for enterocolitis   Admission Labs: WBC 15.6, hemoglobin 16.7, platelets 179  sodium 133, potassium 3.1, bicarb 19, BUN 7, creatinine 0.71, serum glucose 108, lipase 21, ammonia 15, lactic acid 2.5.  Urinalysis showed negative nitrites, moderate leukocytes, 21-50 RBCs, >50 WBCs, many bacteria.  Blood cultures in process.  UDS positive for cocaine, amphetamines, THC.  SARS-CoV-2, influenza, RSV PCR negative   Admission Imaging Studies: CTA chest negative for PE. Mild central bronchial thickening and wall thickening of the distal esophagus seen. Prominent low right hilar node is likely reactive.  CT abdomen/pelvis with contrast showed fluid-filled large and small bowel with areas of wall thickening typical of enterocolitis. Hepatomegaly and hepatic steatosis seen.   Significant Labs:   Significant  Imaging Studies:   Antibiotic Therapy: Anti-infectives (From admission, onward)    Start     Dose/Rate Route Frequency Ordered Stop   08/06/24 0400  cefTRIAXone  (ROCEPHIN ) 2 g in sodium chloride  0.9 % 100 mL IVPB        2 g 200 mL/hr  over 30 Minutes Intravenous Every 24 hours 08/05/24 2328 08/13/24 0359   08/05/24 2015  ceFEPIme  (MAXIPIME ) 2 g in sodium chloride  0.9 % 100 mL IVPB        2 g 200 mL/hr over 30 Minutes Intravenous  Once 08/05/24 2008 08/05/24 2217   08/05/24 2015  metroNIDAZOLE  (FLAGYL ) IVPB 500 mg        500 mg 100 mL/hr over 60 Minutes Intravenous  Once 08/05/24 2008 08/05/24 2350   08/05/24 2015  vancomycin  (VANCOCIN ) IVPB 1000 mg/200 mL premix        1,000 mg 200 mL/hr over 60 Minutes Intravenous  Once 08/05/24 2008 08/05/24 2350       Procedures:   Consultants:    Hospital Course by Problem: * Sepsis (HCC) 08-06-2024 present on admission. Met sepsis criteria with WBC 15.6, lactic acid 2.5, enterocolitis seen on CT abd/pelvis  Acute cystitis without hematuria 08-06-2024 on IV rocephin  Day #2. Urine cx pending. Repeat CBC in AM.  08-07-2024 received her 3rd dose of IV rocephin  today. Urine cx was negative(final). Pt refused lab work multiple times today. Stable for DC  Enterocolitis 08-06-2024 C. Diff and GI diarrhea panel are negative. Pt tolerating soft GI diet. Repeat CBC, CMP in AM. Hopefully home in AM.  08-07-2024 pt tolerating a soft gi diet x 24 hours. 08-07-2024 received her 3rd dose of IV rocephin  today. Pt refused lab work multiple times today. Stable for DC. Prn zofran  at discharge.  Polysubstance use disorder 08-06-2024 UDS tested positive for cocaine, amphetamines and THC. Pt admitted to abusing illicit substances on admission.  08-07-2024 pt advised to stop using illicit drugs.  Hypokalemia-resolved as of 08/07/2024 08-06-2024 this was repleted and now resolved. Today's K is 3.6  Obesity, Class II, BMI 35-39.9 08-06-2024 Body mass index is 35.93 kg/m.   Discharge Diagnoses:  Principal Problem:   Sepsis (HCC) Active Problems:   Acute cystitis without hematuria   Enterocolitis   Polysubstance use disorder   Obesity, Class II, BMI 35-39.9   Discharge  Instructions  Discharge Instructions     Call MD for:  difficulty breathing, headache or visual disturbances   Complete by: As directed    Call MD for:  extreme fatigue   Complete by: As directed    Call MD for:  hives   Complete by: As directed    Call MD for:  persistant dizziness or light-headedness   Complete by: As directed    Call MD for:  persistant nausea and vomiting   Complete by: As directed    Call MD for:  redness, tenderness, or signs of infection (pain, swelling, redness, odor or green/yellow discharge around incision site)   Complete by: As directed    Call MD for:  severe uncontrolled pain   Complete by: As directed    Call MD for:  temperature >100.4   Complete by: As directed    DIET SOFT   Complete by: As directed    Discharge instructions   Complete by: As directed    1. Follow up with your primary care provider in 1-2 weeks following discharge from hospital.   Increase activity slowly   Complete by: As directed  Allergies as of 08/07/2024       Reactions   Influenza A (h1n1) Monovalent Vaccine Swelling   Latex Itching, Rash        Medication List     TAKE these medications    ondansetron  4 MG tablet Commonly known as: ZOFRAN  Take 1 tablet (4 mg total) by mouth every 6 (six) hours as needed for up to 7 days for nausea.        Allergies  Allergen Reactions   Influenza A (H1n1) Monovalent Vaccine Swelling   Latex Itching and Rash    Discharge Exam: Vitals:   08/07/24 0741 08/07/24 1051  BP: (!) 140/94 (!) 144/97  Pulse: 98 (!) 105  Resp: 18 20  Temp: 99.2 F (37.3 C) 98.4 F (36.9 C)  SpO2: 97% 99%   Physical Exam Vitals and nursing note reviewed.  Constitutional:      Appearance: She is obese.  HENT:     Head: Normocephalic and atraumatic.  Eyes:     General: No scleral icterus. Cardiovascular:     Rate and Rhythm: Normal rate and regular rhythm.  Pulmonary:     Effort: Pulmonary effort is normal.     Breath  sounds: Normal breath sounds.  Abdominal:     General: Bowel sounds are normal. There is no distension.     Palpations: Abdomen is soft. There is no mass.     Tenderness: There is no abdominal tenderness.  Skin:    General: Skin is warm and dry.     Capillary Refill: Capillary refill takes less than 2 seconds.  Neurological:     General: No focal deficit present.     Mental Status: She is alert and oriented to person, place, and time.    The results of significant diagnostics from this hospitalization (including imaging, microbiology, ancillary and laboratory) are listed below for reference.    Microbiology: Recent Results (from the past 240 hours)  Resp panel by RT-PCR (RSV, Flu A&B, Covid) Anterior Nasal Swab     Status: None   Collection Time: 08/05/24  8:06 PM   Specimen: Anterior Nasal Swab  Result Value Ref Range Status   SARS Coronavirus 2 by RT PCR NEGATIVE NEGATIVE Final   Influenza A by PCR NEGATIVE NEGATIVE Final   Influenza B by PCR NEGATIVE NEGATIVE Final    Comment: (NOTE) The Xpert Xpress SARS-CoV-2/FLU/RSV plus assay is intended as an aid in the diagnosis of influenza from Nasopharyngeal swab specimens and should not be used as a sole basis for treatment. Nasal washings and aspirates are unacceptable for Xpert Xpress SARS-CoV-2/FLU/RSV testing.  Fact Sheet for Patients: BloggerCourse.com  Fact Sheet for Healthcare Providers: SeriousBroker.it  This test is not yet approved or cleared by the [place]  FDA and has been authorized for detection and/or diagnosis of SARS-CoV-2 by FDA under an Emergency Use Authorization (EUA). This EUA will remain in effect (meaning this test can be used) for the duration of the COVID-19 declaration under Section 564(b)(1) of the Act, 21 U.S.C. section 360bbb-3(b)(1), unless the authorization is terminated or revoked.     Resp Syncytial Virus by PCR NEGATIVE NEGATIVE Final     Comment: (NOTE) Fact Sheet for Patients: BloggerCourse.com  Fact Sheet for Healthcare Providers: SeriousBroker.it  This test is not yet approved or cleared by the [place]  FDA and has been authorized for detection and/or diagnosis of SARS-CoV-2 by FDA under an Emergency Use Authorization (EUA). This EUA will remain in effect (meaning this test can  be used) for the duration of the COVID-19 declaration under Section 564(b)(1) of the Act, 21 U.S.C. section 360bbb-3(b)(1), unless the authorization is terminated or revoked.  Performed at [facility], [address] [place], [place] [address]   Blood Culture (routine x 2)     Status: Abnormal (Preliminary result)   Collection Time: 08/05/24  8:48 PM   Specimen: BLOOD LEFT WRIST  Result Value Ref Range Status   Specimen Description BLOOD LEFT WRIST  Final   Special Requests   Final    BOTTLES DRAWN AEROBIC ONLY Blood Culture results may not be optimal due to an inadequate volume of blood received in culture bottles   Culture  Setup Time   Final    GRAM POSITIVE COCCI AEROBIC BOTTLE ONLY CRITICAL RESULT CALLED TO, READ BACK BY AND VERIFIED WITH: PHARMD KIMBERLY HAMMONS [id number] AT 1656 BY EC    Culture (A)  Final    STAPHYLOCOCCUS HAEMOLYTICUS THE SIGNIFICANCE OF ISOLATING THIS ORGANISM FROM A SINGLE SET OF BLOOD CULTURES WHEN MULTIPLE SETS ARE DRAWN IS UNCERTAIN. PLEASE NOTIFY THE MICROBIOLOGY DEPARTMENT WITHIN ONE WEEK IF SPECIATION AND SENSITIVITIES ARE REQUIRED. Performed at [facility], [address], [place], [place] [address]    Report Status PENDING  Incomplete  Blood Culture (routine x 2)     Status: None (Preliminary result)   Collection Time: 08/05/24  8:48 PM   Specimen: BLOOD LEFT HAND  Result Value Ref Range Status   Specimen Description BLOOD LEFT HAND  Final   Special Requests   Final    BOTTLES DRAWN AEROBIC AND ANAEROBIC BLOOD LEFT HAND    Culture   Final    NO GROWTH 2 DAYS Performed at [facility], [address] [place], [place] [address]    Report Status PENDING  Incomplete  Blood Culture ID Panel (Reflexed)     Status: Abnormal   Collection Time: 08/05/24  8:48 PM  Result Value Ref Range Status   Enterococcus faecalis NOT DETECTED NOT DETECTED Final   Enterococcus Faecium NOT DETECTED NOT DETECTED Final   Listeria monocytogenes NOT DETECTED NOT DETECTED Final   Staphylococcus species DETECTED (A) NOT DETECTED Final    Comment: CRITICAL RESULT CALLED TO, READ BACK BY AND VERIFIED WITH: PHARMD KIMBERLY HAMMONS [id number] AT 1656 BY EC    Staphylococcus aureus (BCID) NOT DETECTED NOT DETECTED Final   Staphylococcus epidermidis DETECTED (A) NOT DETECTED Final    Comment: CRITICAL RESULT CALLED TO, READ BACK BY AND VERIFIED WITH: PHARMD KIMBERLY HAMMONS [id number] AT 1656 BY EC    Staphylococcus lugdunensis NOT DETECTED NOT DETECTED Final   Streptococcus species NOT DETECTED NOT DETECTED Final   Streptococcus agalactiae NOT DETECTED NOT DETECTED Final   Streptococcus pneumoniae NOT DETECTED NOT DETECTED Final   Streptococcus pyogenes NOT DETECTED NOT DETECTED Final   A.calcoaceticus-baumannii NOT DETECTED NOT DETECTED Final   Bacteroides fragilis NOT DETECTED NOT DETECTED Final   Enterobacterales NOT DETECTED NOT DETECTED Final   Enterobacter cloacae complex NOT DETECTED NOT DETECTED Final   Escherichia coli NOT DETECTED NOT DETECTED Final   Klebsiella aerogenes NOT DETECTED NOT DETECTED Final   Klebsiella oxytoca NOT DETECTED NOT DETECTED Final   Klebsiella pneumoniae NOT DETECTED NOT DETECTED Final   Proteus species NOT DETECTED NOT DETECTED Final   Salmonella species NOT DETECTED NOT DETECTED Final   Serratia marcescens NOT DETECTED NOT DETECTED Final   Haemophilus influenzae NOT DETECTED NOT DETECTED Final   Neisseria meningitidis NOT DETECTED  NOT DETECTED Final   Pseudomonas aeruginosa NOT DETECTED NOT  DETECTED Final   Stenotrophomonas maltophilia NOT DETECTED NOT DETECTED Final   Candida albicans NOT DETECTED NOT DETECTED Final   Candida auris NOT DETECTED NOT DETECTED Final   Candida glabrata NOT DETECTED NOT DETECTED Final   Candida krusei NOT DETECTED NOT DETECTED Final   Candida parapsilosis NOT DETECTED NOT DETECTED Final   Candida tropicalis NOT DETECTED NOT DETECTED Final   Cryptococcus neoformans/gattii NOT DETECTED NOT DETECTED Final   Methicillin resistance mecA/C NOT DETECTED NOT DETECTED Final    Comment: Performed at [facility], [address] [place], [place] [address]  Urine Culture (for pregnant, neutropenic or urologic patients or patients with an indwelling urinary catheter)     Status: None   Collection Time: 08/05/24 11:08 PM   Specimen: Urine, Clean Catch  Result Value Ref Range Status   Specimen Description URINE, CLEAN CATCH  Final   Special Requests NONE  Final   Culture   Final    NO GROWTH Performed at [facility], [address] [place], [place] [address]    Report Status 08/07/2024 FINAL  Final  C Difficile Quick Screen w PCR reflex     Status: None   Collection Time: 08/05/24 11:29 PM   Specimen: Stool  Result Value Ref Range Status   C Diff antigen NEGATIVE NEGATIVE Final   C Diff toxin NEGATIVE NEGATIVE Final   C Diff interpretation No C. difficile detected.  Final    Comment: Performed at [facility], [address], [place], [place] [address]  Gastrointestinal Panel by PCR , Stool     Status: None   Collection Time: 08/05/24 11:29 PM   Specimen: Stool  Result Value Ref Range Status   Campylobacter species NOT DETECTED NOT DETECTED Final   Plesimonas shigelloides NOT DETECTED NOT DETECTED Final   Salmonella species NOT DETECTED NOT DETECTED Final   Yersinia enterocolitica NOT DETECTED NOT DETECTED Final   Vibrio species NOT DETECTED NOT DETECTED Final   Vibrio cholerae NOT DETECTED NOT DETECTED Final    Enteroaggregative E coli (EAEC) NOT DETECTED NOT DETECTED Final   Enteropathogenic E coli (EPEC) NOT DETECTED NOT DETECTED Final   Enterotoxigenic E coli (ETEC) NOT DETECTED NOT DETECTED Final   Shiga like toxin producing E coli (STEC) NOT DETECTED NOT DETECTED Final   Shigella/Enteroinvasive E coli (EIEC) NOT DETECTED NOT DETECTED Final   Cryptosporidium NOT DETECTED NOT DETECTED Final   Cyclospora cayetanensis NOT DETECTED NOT DETECTED Final   Entamoeba histolytica NOT DETECTED NOT DETECTED Final   Giardia lamblia NOT DETECTED NOT DETECTED Final   Adenovirus F40/41 NOT DETECTED NOT DETECTED Final   Astrovirus NOT DETECTED NOT DETECTED Final   Norovirus GI/GII NOT DETECTED NOT DETECTED Final   Rotavirus A NOT DETECTED NOT DETECTED Final   Sapovirus (I, II, IV, and V) NOT DETECTED NOT DETECTED Final    Comment: Performed at [facility], [address] [place], [place] [address]     Labs:  Basic Metabolic Panel: Recent Labs  Lab 08/05/24 1927 08/05/24 2027 08/06/24 0323  NA 133* 133* 135  K 3.1* 4.2 3.6  CL 101  --  106  CO2 19*  --  20*  GLUCOSE 108*  --  87  BUN 7  --  5*  CREATININE 0.71  --  0.63  CALCIUM  9.5  --  8.7*  MG 1.6*  --   --  Liver Function Tests: Recent Labs  Lab 08/05/24 1927 08/06/24 0323  AST 40 27  ALT 69* 48*  ALKPHOS 75 62  BILITOT 1.3* 0.9  PROT 8.1 6.2*  ALBUMIN 3.8 2.8*   Recent Labs  Lab 08/05/24 1927  LIPASE 21   Recent Labs  Lab 08/05/24 1927  AMMONIA 15   CBC: Recent Labs  Lab 08/05/24 1927 08/05/24 2027 08/06/24 0323  WBC 15.6*  --  14.3*  HGB 16.7* 17.0* 13.9  HCT 49.7* 50.0* 40.8  MCV 92.0  --  91.1  PLT 179  --  162   Urinalysis    Component Value Date/Time   COLORURINE AMBER (A) 08/05/2024 2055   APPEARANCEUR CLOUDY (A) 08/05/2024 2055   LABSPEC 1.033 (H) 08/05/2024 2055   PHURINE 5.0 08/05/2024 2055   GLUCOSEU NEGATIVE 08/05/2024 2055   HGBUR LARGE (A) 08/05/2024 2055   BILIRUBINUR NEGATIVE  08/05/2024 2055   KETONESUR 5 (A) 08/05/2024 2055   PROTEINUR 100 (A) 08/05/2024 2055   NITRITE NEGATIVE 08/05/2024 2055   LEUKOCYTESUR MODERATE (A) 08/05/2024 2055   Sepsis Labs Recent Labs  Lab 08/05/24 1927 08/06/24 0323  WBC 15.6* 14.3*    Procedures/Studies: CT ABDOMEN PELVIS W CONTRAST Result Date: 08/05/2024 CLINICAL DATA:  Upper abdominal pain.  Nausea. EXAM: CT ABDOMEN AND PELVIS WITH CONTRAST TECHNIQUE: Multidetector CT imaging of the abdomen and pelvis was performed using the standard protocol following bolus administration of intravenous contrast. RADIATION DOSE REDUCTION: This exam was performed according to the departmental dose-optimization program which includes automated exposure control, adjustment of the mA and/or kV according to patient size and/or use of iterative reconstruction technique. CONTRAST:  80mL OMNIPAQUE  IOHEXOL  350 MG/ML SOLN COMPARISON:  None Available. FINDINGS: Lower chest: Assessed on concurrent chest CT, reported separately. Hepatobiliary: Mild diffuse hepatic steatosis. The liver is enlarged spanning 19.1 cm cranial caudal. No focal liver abnormality. Gallbladder physiologically distended, no calcified stone. No biliary dilatation. Pancreas: No ductal dilatation or inflammation. Spleen: Normal in size without focal abnormality. Adrenals/Urinary Tract: Normal adrenal glands. No hydronephrosis or perinephric edema. Homogeneous renal enhancement with symmetric excretion on delayed phase imaging. Urinary bladder is partially distended without wall thickening. Stomach/Bowel: Fluid-filled small bowel in the mid lower abdomen with areas of wall thickening and mesenteric edema. No bowel pneumatosis. No obstruction. Diffuse liquid stool throughout the colon. Occasional areas of colonic wall thickening particularly the sigmoid. Normal appendix. Physiologically distended stomach without gastric wall thickening. Vascular/Lymphatic: Normal caliber abdominal aorta. Patent  portal vein. No portal venous or mesenteric gas. Retroaortic left renal vein. No abdominopelvic adenopathy. Reproductive: Low density in the cervix, nonspecific. No adnexal mass. Other: Trace pelvic free fluid. No focal fluid collection. No free intra-abdominal air. Musculoskeletal: There are no acute or suspicious osseous abnormalities. IMPRESSION: 1. Fluid-filled large and small bowel with areas of wall thickening typical of enterocolitis. This may be infectious or inflammatory. 2. Hepatomegaly and hepatic steatosis. 3. Low density in the cervix, nonspecific. Recommend correlation with physical exam. Electronically Signed   By: Andrea Gasman M.D.   On: 08/05/2024 22:34   CT Angio Chest PE W and/or Wo Contrast Result Date: 08/05/2024 CLINICAL DATA:  Provided history: Pulmonary embolism (PE) suspected, high prob Upper abdominal pain.  Nausea.  EXAM: CT ANGIOGRAPHY CHEST WITH CONTRAST TECHNIQUE: Multidetector CT imaging of the chest was performed using the standard protocol during bolus administration of intravenous contrast. Multiplanar CT image reconstructions and MIPs were obtained to evaluate the vascular anatomy. RADIATION DOSE REDUCTION: This exam was performed according  to the departmental dose-optimization program which includes automated exposure control, adjustment of the mA and/or kV according to patient size and/or use of iterative reconstruction technique. CONTRAST:  80mL OMNIPAQUE  IOHEXOL  350 MG/ML SOLN COMPARISON:  Chest radiograph earlier today FINDINGS: Cardiovascular: There are no filling defects within the pulmonary arteries to suggest pulmonary embolus. Thoracic aorta is normal in caliber, no acute aortic findings. The heart is normal in size. No pericardial effusion. Mediastinum/Nodes: Wall thickening of the distal esophagus. No enlarged mediastinal nodes. Prominent low right hilar node measures 11 mm short axis. No thyroid nodule. Lungs/Pleura: Mild central bronchial thickening. No focal  airspace disease. No pleural effusion. No features of pulmonary edema. No suspicious pulmonary nodule. Upper Abdomen: Assessed on concurrent abdominopelvic CT, reported separately. Musculoskeletal: There are no acute or suspicious osseous abnormalities. Review of the MIP images confirms the above findings. IMPRESSION: 1. No pulmonary embolus. 2. Mild central bronchial thickening, can be seen with bronchitis or reactive airways disease. 3. Wall thickening of the distal esophagus, query reflux or esophagitis. 4. Prominent low right hilar node is likely reactive. Electronically Signed   By: Andrea Gasman M.D.   On: 08/05/2024 22:29   DG Chest Port 1 View Result Date: 08/05/2024 CLINICAL DATA:  Possible sepsis EXAM: PORTABLE CHEST 1 VIEW COMPARISON:  09/09/2016 FINDINGS: The heart size and mediastinal contours are within normal limits. Both lungs are clear. The visualized skeletal structures are unremarkable. Hazy right lung base probably due to overlying soft tissue artifact. IMPRESSION: No active disease. Electronically Signed   By: Luke Bun M.D.   On: 08/05/2024 20:50    Time coordinating discharge: 60 mins  SIGNED:  Camellia Door, DO [facility] 08/07/24, 2:49 PM

## 2024-08-07 NOTE — Plan of Care (Signed)
   Problem: Education: Goal: Knowledge of General Education information will improve Description: Including pain rating scale, medication(s)/side effects and non-pharmacologic comfort measures Outcome: Progressing   Problem: Clinical Measurements: Goal: Will remain free from infection Outcome: Progressing

## 2024-08-08 LAB — CULTURE, BLOOD (ROUTINE X 2)

## 2024-08-10 LAB — CULTURE, BLOOD (ROUTINE X 2): Culture: NO GROWTH

## 2024-09-15 ENCOUNTER — Other Ambulatory Visit: Payer: Self-pay

## 2024-09-15 ENCOUNTER — Emergency Department (HOSPITAL_COMMUNITY)
Admission: EM | Admit: 2024-09-15 | Discharge: 2024-09-16 | Disposition: A | Discharge status: Home / Self Care | Attending: Emergency Medicine | Admitting: Emergency Medicine

## 2024-09-15 DIAGNOSIS — D72829 Elevated white blood cell count, unspecified: Secondary | ICD-10-CM | POA: Diagnosis not present

## 2024-09-15 DIAGNOSIS — N730 Acute parametritis and pelvic cellulitis: Secondary | ICD-10-CM

## 2024-09-15 DIAGNOSIS — R1012 Left upper quadrant pain: Secondary | ICD-10-CM | POA: Diagnosis present

## 2024-09-15 DIAGNOSIS — F17210 Nicotine dependence, cigarettes, uncomplicated: Secondary | ICD-10-CM | POA: Insufficient documentation

## 2024-09-15 DIAGNOSIS — R1084 Generalized abdominal pain: Secondary | ICD-10-CM | POA: Insufficient documentation

## 2024-09-15 DIAGNOSIS — Z91040 Latex allergy status: Secondary | ICD-10-CM | POA: Diagnosis not present

## 2024-09-15 DIAGNOSIS — R000 Tachycardia, unspecified: Secondary | ICD-10-CM | POA: Diagnosis not present

## 2024-09-15 DIAGNOSIS — K529 Noninfective gastroenteritis and colitis, unspecified: Secondary | ICD-10-CM

## 2024-09-15 LAB — URINALYSIS, ROUTINE W REFLEX MICROSCOPIC
Bilirubin Urine: NEGATIVE
Glucose, UA: NEGATIVE mg/dL
Ketones, ur: 5 mg/dL — AB
Nitrite: NEGATIVE
Protein, ur: 30 mg/dL — AB
Specific Gravity, Urine: 1.033 — ABNORMAL HIGH (ref 1.005–1.030)
pH: 5 (ref 5.0–8.0)

## 2024-09-15 LAB — CBC
HCT: 46.7 % — ABNORMAL HIGH (ref 36.0–46.0)
Hemoglobin: 15.5 g/dL — ABNORMAL HIGH (ref 12.0–15.0)
MCH: 30.7 pg (ref 26.0–34.0)
MCHC: 33.2 g/dL (ref 30.0–36.0)
MCV: 92.5 fL (ref 80.0–100.0)
Platelets: 201 K/uL (ref 150–400)
RBC: 5.05 MIL/uL (ref 3.87–5.11)
RDW: 14.1 % (ref 11.5–15.5)
WBC: 13.8 K/uL — ABNORMAL HIGH (ref 4.0–10.5)
nRBC: 0 % (ref 0.0–0.2)

## 2024-09-15 LAB — COMPREHENSIVE METABOLIC PANEL WITH GFR
ALT: 66 U/L — ABNORMAL HIGH (ref 0–44)
AST: 50 U/L — ABNORMAL HIGH (ref 15–41)
Albumin: 3.8 g/dL (ref 3.5–5.0)
Alkaline Phosphatase: 68 U/L (ref 38–126)
Anion gap: 14 (ref 5–15)
BUN: 8 mg/dL (ref 6–20)
CO2: 22 mmol/L (ref 22–32)
Calcium: 8.9 mg/dL (ref 8.9–10.3)
Chloride: 99 mmol/L (ref 98–111)
Creatinine, Ser: 0.74 mg/dL (ref 0.44–1.00)
GFR, Estimated: >60 mL/min (ref >60)
Glucose, Bld: 88 mg/dL (ref 70–99)
Potassium: 3.8 mmol/L (ref 3.5–5.1)
Sodium: 135 mmol/L (ref 135–145)
Total Bilirubin: 0.7 mg/dL (ref 0.0–1.2)
Total Protein: 7.7 g/dL (ref 6.5–8.1)

## 2024-09-15 LAB — LIPASE, BLOOD: Lipase: 22 U/L (ref 11–51)

## 2024-09-15 LAB — HCG, SERUM, QUALITATIVE: Preg, Serum: NEGATIVE

## 2024-09-15 MED ORDER — ACETAMINOPHEN 325 MG PO TABS
650.0000 mg | ORAL_TABLET | Freq: Once | ORAL | Status: AC
  Administered 2024-09-15: 650 mg via ORAL
  Filled 2024-09-15: qty 2

## 2024-09-15 NOTE — ED Triage Notes (Signed)
 C/o abd pain, hx of infection in intestines' pain feels similar

## 2024-09-16 ENCOUNTER — Emergency Department (HOSPITAL_COMMUNITY)

## 2024-09-16 LAB — URINALYSIS, W/ REFLEX TO CULTURE (INFECTION SUSPECTED)
Bilirubin Urine: NEGATIVE
Glucose, UA: NEGATIVE mg/dL
Ketones, ur: 20 mg/dL — AB
Nitrite: NEGATIVE
Protein, ur: 100 mg/dL — AB
Specific Gravity, Urine: 1.035 — ABNORMAL HIGH (ref 1.005–1.030)
pH: 5 (ref 5.0–8.0)

## 2024-09-16 LAB — RESP PANEL BY RT-PCR (RSV, FLU A&B, COVID)  RVPGX2
Influenza A by PCR: NEGATIVE
Influenza B by PCR: NEGATIVE
Resp Syncytial Virus by PCR: NEGATIVE
SARS Coronavirus 2 by RT PCR: NEGATIVE

## 2024-09-16 MED ORDER — METRONIDAZOLE 500 MG/100ML IV SOLN
500.0000 mg | Freq: Once | INTRAVENOUS | Status: AC
  Administered 2024-09-16: 500 mg via INTRAVENOUS
  Filled 2024-09-16: qty 100

## 2024-09-16 MED ORDER — SODIUM CHLORIDE 0.9 % IV SOLN
1.0000 g | Freq: Once | INTRAVENOUS | Status: AC
  Administered 2024-09-16: 1 g via INTRAVENOUS
  Filled 2024-09-16: qty 10

## 2024-09-16 MED ORDER — IOHEXOL 350 MG/ML SOLN
75.0000 mL | Freq: Once | INTRAVENOUS | Status: AC | PRN
Start: 2024-09-16 — End: 2024-09-16
  Administered 2024-09-16: 75 mL via INTRAVENOUS

## 2024-09-16 MED ORDER — DOXYCYCLINE HYCLATE 100 MG PO CAPS: 100.0000 mg | ORAL_CAPSULE | Freq: Two times a day (BID) | ORAL | 0 refills | Status: AC

## 2024-09-16 MED ORDER — IBUPROFEN 600 MG PO TABS: 600.0000 mg | ORAL_TABLET | Freq: Four times a day (QID) | ORAL | 0 refills | Status: AC | PRN

## 2024-09-16 MED ORDER — SODIUM CHLORIDE 0.9 % IV BOLUS
1000.0000 mL | Freq: Once | INTRAVENOUS | Status: AC
  Administered 2024-09-16: 1000 mL via INTRAVENOUS

## 2024-09-16 MED ORDER — ONDANSETRON 8 MG PO TBDP: 8.0000 mg | ORAL_TABLET | Freq: Three times a day (TID) | ORAL | 0 refills | Status: AC | PRN

## 2024-09-16 MED ORDER — METRONIDAZOLE 500 MG PO TABS: 500.0000 mg | ORAL_TABLET | Freq: Two times a day (BID) | ORAL | 0 refills | Status: AC

## 2024-09-16 MED ORDER — OXYCODONE HCL 5 MG PO TABS
5.0000 mg | ORAL_TABLET | Freq: Once | ORAL | Status: AC
  Administered 2024-09-16: 5 mg via ORAL
  Filled 2024-09-16: qty 1

## 2024-09-16 MED ORDER — SODIUM CHLORIDE 0.9 % IV SOLN
100.0000 mg | Freq: Once | INTRAVENOUS | Status: AC
  Administered 2024-09-16: 100 mg via INTRAVENOUS
  Filled 2024-09-16: qty 100

## 2024-09-16 NOTE — Discharge Instructions (Addendum)
 The CAT scan shows inflammation of the colon and the ultrasound reveals some swelling around your ovaries and fallopian tubes.  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  Take the antibiotics that are prescribed.  Return to the emergency room if you start having worsening symptoms.

## 2024-09-16 NOTE — ED Notes (Signed)
 Pt has had 2 unsuccessful sticks at IV attempts, one by this RN and another. Pt states she wants the Oxycodone  to kick in again before another stick, declines for IV team to come and try a line.

## 2024-09-16 NOTE — ED Notes (Signed)
 Pt provided free bus pass

## 2024-09-16 NOTE — ED Provider Notes (Signed)
 [place] EMERGENCY DEPARTMENT AT [facility] Provider Note   CSN: [id number] Arrival date & time: 09/15/24  2223     Patient presents with: Abdominal Pain   Jasmine Taylor is a 28 y.o. female.   The history is provided by the patient.  Abdominal Pain Jasmine Taylor is a 28 y.o. female who presents to the Emergency Department complaining of abdominal pain. She presents the emergency department for evaluation of abdominal pain that started in the left upper quadrant radiates to the right upper quadrant. Symptoms started two days ago. She has associated cough, subjective fever. No vomiting or diarrhea. She does report two days of dysuria. No vaginally discharge. She reports similar symptoms requiring admission for intestinal infection. She does smoke cigarettes. Denies any alcohol or drug use.     Prior to Admission medications   Not on File    Allergies: Influenza a (h1n1) monovalent vaccine and Latex    Review of Systems  Gastrointestinal:  Positive for abdominal pain.  All other systems reviewed and are negative.   Updated Vital Signs BP 135/75   Pulse (!) 110   Temp 98.5 F (36.9 C) (Oral)   Resp 20   SpO2 100%   Physical Exam Vitals and nursing note reviewed.  Constitutional:      Appearance: She is well-developed.  HENT:     Head: Normocephalic and atraumatic.  Cardiovascular:     Rate and Rhythm: Regular rhythm. Tachycardia present.     Heart sounds: No murmur heard. Pulmonary:     Effort: Pulmonary effort is normal. No respiratory distress.     Breath sounds: Normal breath sounds.  Abdominal:     Palpations: Abdomen is soft.     Tenderness: There is no guarding or rebound.     Comments: Moderate generalized abdominal tenderness  Musculoskeletal:        General: No tenderness.  Skin:    General: Skin is warm and dry.  Neurological:     Mental Status: She is alert and oriented to person, place, and time.  Psychiatric:         Behavior: Behavior normal.     (all labs ordered are listed, but only abnormal results are displayed) Labs Reviewed  COMPREHENSIVE METABOLIC PANEL WITH GFR - Abnormal; Notable for the following components:      Result Value   AST 50 (*)    ALT 66 (*)    All other components within normal limits  CBC - Abnormal; Notable for the following components:   WBC 13.8 (*)    Hemoglobin 15.5 (*)    HCT 46.7 (*)    All other components within normal limits  URINALYSIS, ROUTINE W REFLEX MICROSCOPIC - Abnormal; Notable for the following components:   Color, Urine AMBER (*)    APPearance CLOUDY (*)    Specific Gravity, Urine 1.033 (*)    Hgb urine dipstick LARGE (*)    Ketones, ur 5 (*)    Protein, ur 30 (*)    Leukocytes,Ua SMALL (*)    Bacteria, UA RARE (*)    All other components within normal limits  RESP PANEL BY RT-PCR (RSV, FLU A&B, COVID)  RVPGX2  LIPASE, BLOOD  HCG, SERUM, QUALITATIVE  URINALYSIS, W/ REFLEX TO CULTURE (INFECTION SUSPECTED)    EKG: None  Radiology: DG Chest 2 View Result Date: 09/16/2024 EXAM: 2 VIEW(S) XRAY OF THE CHEST 09/16/2024 06:56:00 AM COMPARISON: CTA chest 08/05/2024, and earlier. CLINICAL HISTORY: 28 year old female. Fever,  cough, body aches, abdominal pain, history of intestinal infection. FINDINGS: LUNGS AND PLEURA: No focal pulmonary opacity. No pulmonary edema. No pleural effusion. No pneumothorax. HEART AND MEDIASTINUM: No acute abnormality of the cardiac and mediastinal silhouettes. BONES AND SOFT TISSUES: No acute osseous abnormality. Negative for visible bowel gas pattern. IMPRESSION: 1. Negative, no cardiopulmonary abnormality. Electronically signed by: Helayne Hurst MD 09/16/2024 07:00 AM EDT RP Workstation: [id number]     Procedures   Medications Ordered in the ED  acetaminophen  (TYLENOL ) tablet 650 mg (650 mg Oral Given 09/15/24 2241)  sodium chloride  0.9 % bolus 1,000 mL (1,000 mLs Intravenous New Bag/Given 09/16/24 0707)  oxyCODONE  (Oxy  IR/ROXICODONE ) immediate release tablet 5 mg (5 mg Oral Given 09/16/24 0639)  iohexol  (OMNIPAQUE ) 350 MG/ML injection 75 mL (75 mLs Intravenous Contrast Given 09/16/24 0729)                                    Medical Decision Making Amount and/or Complexity of Data Reviewed Labs: ordered. Radiology: ordered.  Risk OTC drugs. Prescription drug management.   Patient here for evaluation of abdominal pain, fever, cough. She has tenderness on examination without peritoneal findings. She did have a low-grade temperature at time of ED arrival, mild tachycardia. Urinalysis is difficult to interpret due to numerous epithelial cells present - recommend with patient recollection. CBC with mild leukocytosis. Given her abdominal tenderness plan to obtain CT abdomen pelvis to further evaluate her pain. Chest x-ray is negative for acute infiltrate, current picture is not consistent with pneumonia. Patient care transferred pending a CT scan, urinalysis and COVID swab.     Final diagnoses:  None    ED Discharge Orders     None          Griselda Norris, MD 09/16/24 [phone]

## 2024-09-16 NOTE — ED Provider Notes (Addendum)
  Physical Exam  BP 125/82 (BP Location: Left Arm)   Pulse 100   Temp 98.5 F (36.9 C) (Oral)   Resp 17   SpO2 100%   Physical Exam  Procedures  Procedures  ED Course / MDM   Clinical Course as of 09/16/24 1443  Fri Sep 16, 2024  9043 Pt was found to have CT that are concerning for pelvic infection, with some fluid collection around the ovaries.  Ultrasound has been ordered.  Patient is in hallway.  On exam, she does have lower quadrant tenderness.  Ultrasound has been ordered.  Nursing staff requested to place patient in a room.  For now, we will start antibiotics. [AN]  1438 Patient's ultrasound shows distended fallopian tube.  No TOA.  We will treat this as PID.  Unfortunately, despite request for patient to be bedded, in the room, we have had too many high acuity patients, and charge nurse was unable to put patient in a bed for pelvic exam.  I do not think pelvic exam will change the management at this time.  Stable for discharge. [AN]  1443 The patient appears reasonably screened and/or stabilized for discharge and I doubt any other medical condition or other [facility] requiring further screening, evaluation, or treatment in the ED at this time prior to discharge.  Results from the ER workup discussed with the patient face to face and all questions answered to the best of my ability. The patient is safe for discharge with strict return precautions.   [AN]    Clinical Course User Index [AN] Charlyn Sora, MD   Medical Decision Making Amount and/or Complexity of Data Reviewed Labs: ordered. Radiology: ordered.  Risk OTC drugs. Prescription drug management.   Pt comes in w/ cc of abd pain, uri like symptoms. She reports hx of abd infection.  Has SIRS.  CT and UA pending. Resp panel pending.         Charlyn Sora, MD 09/16/24 9284    Charlyn Sora, MD 09/16/24 [phone]
